# Patient Record
Sex: Male | Born: 1975 | Race: White | Hispanic: No | Marital: Single | Smoking: Former smoker
Health system: Southern US, Community
[De-identification: ages and names within clinical notes are randomized; demographics above are authoritative.]

## PROBLEM LIST (undated history)

## (undated) DIAGNOSIS — Q8500 Neurofibromatosis, unspecified: Secondary | ICD-10-CM

## (undated) DIAGNOSIS — F32A Depression, unspecified: Secondary | ICD-10-CM

## (undated) DIAGNOSIS — B019 Varicella without complication: Secondary | ICD-10-CM

## (undated) HISTORY — DX: Depression, unspecified: F32.A

## (undated) HISTORY — DX: Neurofibromatosis, unspecified: Q85.00

## (undated) HISTORY — PX: WISDOM TOOTH EXTRACTION: SHX21

## (undated) HISTORY — PX: OTHER SURGICAL HISTORY: SHX169

## (undated) HISTORY — DX: Varicella without complication: B01.9

---

## 2004-10-10 HISTORY — PX: OTHER SURGICAL HISTORY: SHX169

## 2005-01-27 ENCOUNTER — Ambulatory Visit: Payer: Self-pay | Admitting: Pulmonary Disease

## 2005-01-28 ENCOUNTER — Ambulatory Visit: Payer: Self-pay | Admitting: Cardiology

## 2005-01-31 ENCOUNTER — Ambulatory Visit (HOSPITAL_COMMUNITY): Admission: RE | Admit: 2005-01-31 | Discharge: 2005-01-31 | Payer: Self-pay | Admitting: Pulmonary Disease

## 2005-01-31 ENCOUNTER — Ambulatory Visit: Payer: Self-pay | Admitting: Cardiology

## 2005-02-04 ENCOUNTER — Ambulatory Visit (HOSPITAL_COMMUNITY): Admission: RE | Admit: 2005-02-04 | Discharge: 2005-02-04 | Payer: Self-pay | Admitting: Pulmonary Disease

## 2005-02-23 ENCOUNTER — Ambulatory Visit: Payer: Self-pay | Admitting: Pulmonary Disease

## 2005-03-18 ENCOUNTER — Inpatient Hospital Stay (HOSPITAL_COMMUNITY): Admission: RE | Admit: 2005-03-18 | Discharge: 2005-03-23 | Payer: Self-pay | Admitting: General Surgery

## 2005-03-18 ENCOUNTER — Encounter (INDEPENDENT_AMBULATORY_CARE_PROVIDER_SITE_OTHER): Payer: Self-pay | Admitting: Specialist

## 2005-05-27 ENCOUNTER — Encounter: Admission: RE | Admit: 2005-05-27 | Discharge: 2005-05-27 | Payer: Self-pay | Admitting: General Surgery

## 2007-10-11 HISTORY — PX: OTHER SURGICAL HISTORY: SHX169

## 2008-01-29 ENCOUNTER — Encounter (INDEPENDENT_AMBULATORY_CARE_PROVIDER_SITE_OTHER): Payer: Self-pay | Admitting: Neurosurgery

## 2008-01-29 ENCOUNTER — Inpatient Hospital Stay (HOSPITAL_COMMUNITY): Admission: RE | Admit: 2008-01-29 | Discharge: 2008-02-01 | Payer: Self-pay | Admitting: Neurosurgery

## 2009-08-19 ENCOUNTER — Encounter: Admission: RE | Admit: 2009-08-19 | Discharge: 2009-08-19 | Payer: Self-pay | Admitting: Neurosurgery

## 2010-10-31 ENCOUNTER — Encounter: Payer: Self-pay | Admitting: General Surgery

## 2011-02-22 NOTE — Op Note (Signed)
Eric Hansen, Eric Hansen                 ACCOUNT NO.:  [id number]   MEDICAL RECORD NO.:  [id number]          PATIENT TYPE:  INP   LOCATION:  3114                         FACILITY:  [facility]   PHYSICIAN:  Danae Orleans. Venetia Maxon, M.D.  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   PROCEDURE:  1. L1-L2 laminectomy.  2. Resection of intradural spinal cord tumor.  3. Microdissection.  4. EMG and SSEP monitoring.  5. Intraoperative ultrasound.   SURGEON:  Danae Orleans. Venetia Maxon, MD   ASSISTANT:  Cristi Loron, MD  Georgiann Cocker, RN   ANESTHESIA:  General endotracheal anesthesia.   ESTIMATED BLOOD LOSS:  250 mL.   PATHOLOGY:  Consistent with a schwannoma.   DISPOSITION:  Recovery.   COMPLICATIONS:  None.   INDICATIONS:  Eric Hansen is a 35 year old man with multiple  schwannomas, who has a large anterior intradural tumor consistent with  schwannoma at the level of L1-L2, which is causing him bowel and bladder  dysfunction and myelopathy.  It was elected to take him to surgery for  removal of this intradural tumor.  Because of the patient's young age as  well as the fact that he is having bowel and bladder control issues, we  elected to use SSEP and motor evoked monitoring including anal sphincter  monitoring during the surgery.   PROCEDURE:  Mr. Dollins was brought to the operating room.  Following  satisfactory and uncomplicated induction of general endotracheal  anesthesia and placing intravenous lines and Foley catheter, he was  placed in a prone position on the flat rolls.  Three spinal needles were  taped to his skin and an intraoperative x-ray of the middle spinal  needle was felt to be at the L1 level.   Subsequently, a incision was  planned from top of L1 to the bottom of L2.  The skin was then prepped  and draped in the usual sterile fashion.  Area of planned incision was  infiltrated local lidocaine.  Incision was made, carried through adipose  tissues of posterior thoracolumbar fascia which was then incised.  Bilateral subperiosteal dissection was performed exposing the L1 and L2  spinous processes.  After intraoperative confirmatory x-ray, these were  removed and subsequently a high-speed drill was used to complete the  laminectomy of L1-L2 and this was completed then with Kerrison rongeurs.  The spinal cord dura was then exposed.  Hemostasis was obtained.  The  microscope brought into the field.  Prior to bringing the microscope  into the field, intraoperative ultrasound was performed to confirm the  position of the intradural tumor.  A dural incision was made overlying  the superior and inferior aspects of the intraspinal tumor with 15 blade  and then using a Public house manager.  The dura was opened.  Dural tack up  stitches were placed  with 4-0 Nurolon stitches.  The arachnoid was then  opened.  This was all done under microscopic visualization.  The  arachnoid was opened and the tumor was identified.  Tumor was highly  friable and quite vascular.  Efforts were made to mobilize and caused it  to bleed.  Consequently, the tumor capsule was cauterized with bipolar  electrocautery and eventually after initial effort to remove it by  rolling it out of the spinal cord was not possible because it was  densely adherent.  It was elected to debulk the tumor and this was done  with bipolar electrocautery as well as CUSA.  The tumor was then removed  in its entirety.  There were small remaining fascicles which appeared to  be the origin of the tumor and these were cauterized with bipolar  electrocautery and removed with micro scissors.  The entire tumor  appeared to have been removed.   Hemostasis was assured with bipolar  cautery and Gelfoam.  The spinal cord appeared to be in good condition,  and the spinal canal was irrigated extensively in order to remove any  remaining blood.  The dura was then closed with 6-0 Prolene running  stitch and hemostasis was assured with Gelfoam along the edge of the  spinal canal.  With a Valsalva maneuver, there did not appear to be any  egress of CSF.  The self-retaining retractor was removed.  The posterior  spinal musculature was reapproximated with 0 Vicryl sutures.  This  fascia was reapproximated with 0 Vicryl sutures.  The subcutaneous  tissues were approximated with 2-0 Vicryl interrupted inverted sutures  and the skin edges were reapproximated with running 3-0 nylon lock  stitch.  The wound was dressed with bacitracin, Telfa, gauze, and tape.  The patient was x-rayed in the operating room and taken to recovery in  stable and satisfactory lesion.  Initial pathology was consistent with  schwannoma.  The patient had no EMG or SSEP perturbations throughout the  surgery.      Danae Orleans. Venetia Maxon, M.D.  Electronically Signed     JDS/MEDQ  D:  01/29/2008  T:  01/30/2008  Job:  [id number]

## 2011-02-25 NOTE — Op Note (Signed)
NAMEPEARLEY, BARANEK                 ACCOUNT NO.:  [id number]   MEDICAL RECORD NO.:  [id number]          PATIENT TYPE:  INP   LOCATION:  1613                         FACILITY:  [facility]   PHYSICIAN:  Adolph Pollack, M.D.DATE OF BIRTH:  07-20-1976   DATE OF PROCEDURE:  03/18/2005  DATE OF DISCHARGE:                                 OPERATIVE REPORT   PREOPERATIVE DIAGNOSIS:  Retroperitoneal lymphadenopathy.   POSTOPERATIVE DIAGNOSIS:  Retroperitoneal lymphadenopathy.   PROCEDURE:  Exploratory laparotomy with limited aortocaval lymph node  dissection (multiple lymph node biopsies).   SURGEON:  Adolph Pollack, M.D.   ANESTHESIA:  General.   INDICATIONS:  This is a 35 year old male who had some right lower quadrant  and groin pain.  No obvious hernia on exam.  He underwent a CT scan of the  abdomen and pelvis and was noted to have significant retroperitoneal  adenopathy in the aortocaval and retrocaval areas.  He has no testicular  masses on physical exam or on ultrasound.  A PET scan was done which showed  low hypermetabolic activity concerning for lymphoma.  He now presents for  biopsy.  The procedure and risks were discussed with him preoperatively.   TECHNIQUE:  He was seen in the holding area, then brought to the operating  room.  Placed supine on the operating room table.  General anesthetic was  administered.  The hair on the abdominal wall was clipped, and the area was  sterilely prepped and draped.  A midline incision was made through the skin  and subcutaneous tissue, fascia and peritoneum.  The abdomen was explored.  No liver or bowel abnormalities were noted.  No obvious palpable hernias in  the right inguinal area.  I went to the root of the mesentery, retracting  the omentum and transverse colon superiorly and could palpate the adenopathy  in the retroperitoneal area.  I identified the fourth portion of the  duodenum and retracted this superiorly.  I incised the  retroperitoneal  tissue on the fourth portion of the duodenum down towards the bifurcation of  the aorta.  Used careful dissection to identify the aortocaval adenopathy.  The aorta and vena cava were identified.  I carefully began dissecting along  the large aortocaval nodes, dissecting them free and placing clips on the  lymphatics.  The aorta was excised with a Bovie and sharply.  Multiple  specimens were sent.  There was some oozing.  I placed some Surgicel and a  dry laparotomy pad, and this resolved.   Following this, I made sure hemostasis was adequate.  Once it was, I  reapproximated the retroperitoneum with a running 0 Vicryl suture.  All  needle, sponge, and instrument counts were then reported to be correct.  Subsequently, reapproximated the fascia with a running #1 PDS suture.  The  subcutaneous tissue was then noted to be hemostatic, and the skin was then  closed with staples.  A sterile dressing was applied.   Patient tolerated the procedure well without any apparent complications and  was taken to the recovery room in satisfactory condition.  TJR/MEDQ  D:  03/18/2005  T:  03/18/2005  Job:  [id number]   cc:   Lonzo Cloud. Kriste Basque, M.D. [facility]

## 2011-02-25 NOTE — Discharge Summary (Signed)
NAMEREASE, WENCE                 ACCOUNT NO.:  [id number]   MEDICAL RECORD NO.:  [id number]          PATIENT TYPE:  INP   LOCATION:  1613                         FACILITY:  [facility]   PHYSICIAN:  Adolph Pollack, M.D.DATE OF BIRTH:  1976/04/16   DATE OF ADMISSION:  03/18/2005  DATE OF DISCHARGE:  03/23/2005                                 DISCHARGE SUMMARY   PRINCIPAL DIAGNOSIS:  Retroperitoneal schwannoma.   SECONDARY DIAGNOSES:  Postoperative ileus.   PROCEDURE:  Exploratory laparotomy and partial excision of retroperitoneal  schwannoma.   REASON FOR ADMISSION:  This is a 35 year old male having some right groin  pain.  No testicular masses were found on exam or ultrasound.  A CT scan  demonstrated significant retroperitoneal adenopathy in the aortocaval  region.  He had had some night sweats and concern was for lymphoma.  Needle  biopsy was unable to be obtained so open biopsy was indicated and he was  admitted for that.   HOSPITAL COURSE:  He tolerated the procedure fairly well.  He had some  nausea and some postoperative ileus.  Of note was that his pathology came  back consistent with a schwannoma.  Apparently there are several family  members who had schwannoma's as well.  He has started tolerating a clear  liquid diet and was able to have his diet advanced.  Bowels moved and by his  fifth postoperative day, he was able to be discharged.   DISCHARGE CONDITION:  Discharged to home on June 14/2006 in satisfactory  condition.   DISCHARGE MEDICATIONS:  Vicodin for pain.   ACTIVITY:  He was given activity restrictions.   DISCHARGE INSTRUCTIONS:  Wound care instructions.   FOLLOW-UP PLAN:  He will come back in for follow up, have staples removed  and call for any problems.       TJR/MEDQ  D:  04/11/2005  T:  04/11/2005  Job:  [id number]

## 2011-02-25 NOTE — Discharge Summary (Signed)
Eric Hansen, Eric Hansen                 ACCOUNT NO.:  [id number]   MEDICAL RECORD NO.:  [id number]          PATIENT TYPE:  INP   LOCATION:  3020                         FACILITY:  [facility]   PHYSICIAN:  Danae Orleans. Venetia Maxon, M.D.  DATE OF BIRTH:  December 07, 1975   DATE OF ADMISSION:  01/29/2008  DATE OF DISCHARGE:  02/01/2008                               DISCHARGE SUMMARY   REASON FOR ADMISSION:  Intraspinal intradural neoplasm with myelopathy.   FINAL DIAGNOSIS:  Intraspinal intradural neoplasm with myelopathy.   HISTORY OF ILLNESS AND HOSPITAL COURSE:  Eric Hansen is a 35 year old  man with intradural tumor at L1-2 with significant spinal myelopathy and  cord compression.  He underwent a same day's procedure admission, L1 and  L2 laminectomy with resection of intradural tumor with microdissection  using EMG and SSEP monitoring and intraoperative ultrasound.  Postoperatively, the patient did well, was kept on bedrest.  For the  first couple of days, he was observed in the ICU, was gradually  mobilized and doing well as of February 01, 2008.  He was discharged home  on Percocet and Robaxin with instructions to follow up in the office, 2  weeks postoperatively for staple removal.      Danae Orleans. Venetia Maxon, M.D.  Electronically Signed     JDS/MEDQ  D:  02/29/2008  T:  03/01/2008  Job:  [id number]

## 2011-07-05 LAB — CBC
MCHC: 35
MCV: 91.2
Platelets: 212
RDW: 13.6

## 2011-07-05 LAB — BASIC METABOLIC PANEL
BUN: 10
Chloride: 101
Creatinine, Ser: 0.88
Sodium: 136

## 2013-01-31 ENCOUNTER — Other Ambulatory Visit: Payer: Self-pay

## 2013-10-14 DIAGNOSIS — G8928 Other chronic postprocedural pain: Secondary | ICD-10-CM | POA: Insufficient documentation

## 2014-01-13 DIAGNOSIS — M5417 Radiculopathy, lumbosacral region: Secondary | ICD-10-CM | POA: Insufficient documentation

## 2014-04-09 DIAGNOSIS — M5416 Radiculopathy, lumbar region: Secondary | ICD-10-CM | POA: Insufficient documentation

## 2014-09-30 DIAGNOSIS — M545 Low back pain, unspecified: Secondary | ICD-10-CM | POA: Insufficient documentation

## 2014-11-19 ENCOUNTER — Encounter: Payer: Self-pay | Admitting: Physician Assistant

## 2014-11-19 ENCOUNTER — Ambulatory Visit (INDEPENDENT_AMBULATORY_CARE_PROVIDER_SITE_OTHER): Payer: BLUE CROSS/BLUE SHIELD | Admitting: Physician Assistant

## 2014-11-19 VITALS — BP 118/88 | HR 90 | Temp 97.9°F | Resp 16 | Ht 70.5 in | Wt 239.2 lb

## 2014-11-19 DIAGNOSIS — B9789 Other viral agents as the cause of diseases classified elsewhere: Principal | ICD-10-CM

## 2014-11-19 DIAGNOSIS — J069 Acute upper respiratory infection, unspecified: Secondary | ICD-10-CM

## 2014-11-19 LAB — POCT RAPID STREP A (OFFICE): RAPID STREP A SCREEN: NEGATIVE

## 2014-11-19 MED ORDER — BENZONATATE 100 MG PO CAPS: 100.0000 mg | ORAL_CAPSULE | Freq: Two times a day (BID) | ORAL | Status: DC | PRN

## 2014-11-19 NOTE — Progress Notes (Signed)
   Patient presents to clinic today c/o PND and scratchy throat since Sunday.  Denies fever, chills, aches.  Endorses some sinus pressure, but denies sinus pain, ear pain or tooth pain.  Endorses non-productive cough. Denies recent travel or sick contact. Has taken Nyquil and antihistamine without improvement in symptoms.  Past Medical History  Diagnosis Date  . Neurofibromatosis   . Chickenpox     No current outpatient prescriptions on file prior to visit.   No current facility-administered medications on file prior to visit.    Allergies  Allergen Reactions  . Morphine And Related Other (See Comments)    Family History  Problem Relation Age of Onset  . Diverticulitis Mother     Living  . Obesity Father     Living  . Other Father   . Neurofibromatosis Brother   . Neurofibromatosis Father   . Neurofibromatosis Paternal Uncle   . Neurofibromatosis Cousin     Paternal  . Alzheimer's disease Maternal Grandmother   . Thyroid cancer Mother     History   Social History  . Marital Status: Single    Spouse Name: N/A  . Number of Children: N/A  . 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 Smoking status: Former Smoker    Types: Cigarettes    Quit date: 09/30/2003  . Smokeless tobacco: Never Used  . Alcohol Use: No  . Drug Use: Yes     Comment: rare marijuana  . Sexual Activity:    Partners: Female   Other Topics Concern  . None   Social History Narrative   Review of Systems - See HPI.  All other ROS are negative.  BP 118/88 mmHg  Pulse 90  Temp(Src) 97.9 F (36.6 C) (Oral)  Resp 16  Ht 5' 10.5" (1.791 m)  Wt 239 lb 4 oz (108.523 kg)  BMI 33.83 kg/m2  SpO2 97%  Physical Exam  Constitutional: He is oriented to person, place, and time and well-developed, well-nourished, and in no distress.  HENT:  Head: Normocephalic and atraumatic.  Right Ear: Tympanic membrane, external ear and ear canal  normal.  Left Ear: Tympanic membrane, external ear and ear canal normal.  Nose: Nose normal.  Mouth/Throat: Uvula is midline and mucous membranes are normal. Posterior oropharyngeal erythema present. No oropharyngeal exudate, posterior oropharyngeal edema or tonsillar abscesses.  Eyes: Conjunctivae are normal.  Neck: Neck supple.  Cardiovascular: Normal rate, regular rhythm, normal heart sounds and intact distal pulses.   Pulmonary/Chest: Effort normal and breath sounds normal. No respiratory distress. He has no wheezes. He has no rales. He exhibits no tenderness.  Lymphadenopathy:    He has no cervical adenopathy.  Neurological: He is alert and oriented to person, place, and time.  Skin: Skin is warm and dry. No rash noted.  Psychiatric: Affect normal.  Vitals reviewed.  Assessment/Plan: Viral URI with cough POC strep negative. Suspect viral etiology.  Increase fluids.  Rest. Salt water gargles and OTC medication for throat pain. Mucinex for congestion.  Tessalon per orders for cough. Return precautions discussed with patient.

## 2014-11-19 NOTE — Patient Instructions (Signed)
Your symptoms seem consistent with a viral upper respiratory infection.  Your exam looks good overall and your strep test was negative.  I recommend you increase your fluids and get plenty of rest. Salt-water gargles and your pain medication will help with throat. A humidifier in the bedroom will also be beneficial. Please use the Tessalon Perles as directed for cough.  Call or return to clinic if symptoms worsen or are not continuing to resolve.  Viral Infections A virus is a type of germ. Viruses can cause:  Minor sore throats.  Aches and pains.  Headaches.  Runny nose.  Rashes.  Watery eyes.  Tiredness.  Coughs.  Loss of appetite.  Feeling sick to your stomach (nausea).  Throwing up (vomiting).  Watery poop (diarrhea). HOME CARE   Only take medicines as told by your doctor.  Drink enough water and fluids to keep your pee (urine) clear or pale yellow. Sports drinks are a good choice.  Get plenty of rest and eat healthy. Soups and broths with crackers or rice are fine. GET HELP RIGHT AWAY IF:   You have a very bad headache.  You have shortness of breath.  You have chest pain or neck pain.  You have an unusual rash.  You cannot stop throwing up.  You have watery poop that does not stop.  You cannot keep fluids down.  You or your child has a temperature by mouth above 102 F (38.9 C), not controlled by medicine.  Your baby is older than 3 months with a rectal temperature of 102 F (38.9 C) or higher.  Your baby is 54 months old or younger with a rectal temperature of 100.4 F (38 C) or higher. MAKE SURE YOU:   Understand these instructions.  Will watch this condition.  Will get help right away if you are not doing well or get worse. Document Released: 09/08/2008 Document Revised: 12/19/2011 Document Reviewed: 02/01/2011 [facility] Patient Information 2015 [place], [place]. This information is not intended to replace advice given to you by your health  care provider. Make sure you discuss any questions you have with your health care provider.

## 2014-11-19 NOTE — Progress Notes (Signed)
Pre visit review using our clinic review tool, if applicable. No additional management support is needed unless otherwise documented below in the visit note/SLS  

## 2014-11-19 NOTE — Assessment & Plan Note (Signed)
POC strep negative. Suspect viral etiology.  Increase fluids.  Rest. Salt water gargles and OTC medication for throat pain. Mucinex for congestion.  Tessalon per orders for cough. Return precautions discussed with patient.

## 2014-12-08 ENCOUNTER — Ambulatory Visit: Payer: Self-pay | Admitting: Physician Assistant

## 2014-12-11 ENCOUNTER — Telehealth: Payer: Self-pay

## 2014-12-11 NOTE — Telephone Encounter (Signed)
Medication: Review, verify sig & reconcile(including outside meds): yes Duplicates discarded: n/a  DM supply source:  n/a  Local pharmacy: [id number] - [place], [place] - [address] AT [place]  Allergies verified: yes  Immunization Status: Prompted for insurance verification: yes Flu vaccine--DUE Tdap--DUE   A/P:   Changes to [place], PSH or Personal Hx: Reviewed. No changes.  Care Teams Updated: ED/Hospital/Urgent Care Visits:  No Prompted for: Updated insurance, contact information, forms: yes Remind to bring: DPR information, advance directives: yes  To Discuss with Provider:  Nothing at this time.

## 2014-12-15 ENCOUNTER — Ambulatory Visit (INDEPENDENT_AMBULATORY_CARE_PROVIDER_SITE_OTHER): Payer: BLUE CROSS/BLUE SHIELD | Admitting: Physician Assistant

## 2014-12-15 ENCOUNTER — Encounter: Payer: Self-pay | Admitting: Physician Assistant

## 2014-12-15 VITALS — BP 105/61 | HR 81 | Temp 97.6°F | Resp 16 | Ht 70.5 in | Wt 246.0 lb

## 2014-12-15 DIAGNOSIS — Z23 Encounter for immunization: Secondary | ICD-10-CM | POA: Insufficient documentation

## 2014-12-15 DIAGNOSIS — Q8500 Neurofibromatosis, unspecified: Secondary | ICD-10-CM

## 2014-12-15 DIAGNOSIS — Z0000 Encounter for general adult medical examination without abnormal findings: Secondary | ICD-10-CM | POA: Diagnosis not present

## 2014-12-15 LAB — HEPATIC FUNCTION PANEL
ALT: 32 U/L (ref 0–53)
AST: 17 U/L (ref 0–37)
Albumin: 4.5 g/dL (ref 3.5–5.2)
Alkaline Phosphatase: 96 U/L (ref 39–117)
BILIRUBIN DIRECT: 0.1 mg/dL (ref 0.0–0.3)
BILIRUBIN TOTAL: 0.3 mg/dL (ref 0.2–1.2)
Total Protein: 7.4 g/dL (ref 6.0–8.3)

## 2014-12-15 LAB — BASIC METABOLIC PANEL
BUN: 20 mg/dL (ref 6–23)
CALCIUM: 9.5 mg/dL (ref 8.4–10.5)
CHLORIDE: 108 meq/L (ref 96–112)
CO2: 25 meq/L (ref 19–32)
Creatinine, Ser: 0.82 mg/dL (ref 0.40–1.50)
GFR: 111.43 mL/min (ref >60.00)
Glucose, Bld: 94 mg/dL (ref 70–99)
Potassium: 4 mEq/L (ref 3.5–5.1)
SODIUM: 139 meq/L (ref 135–145)

## 2014-12-15 LAB — LIPID PANEL
CHOL/HDL RATIO: 5
Cholesterol: 210 mg/dL — ABNORMAL HIGH (ref 0–200)
HDL: 44 mg/dL (ref >39.00)
LDL Cholesterol: 135 mg/dL — ABNORMAL HIGH (ref 0–99)
NonHDL: 166
TRIGLYCERIDES: 156 mg/dL — AB (ref 0.0–149.0)
VLDL: 31.2 mg/dL (ref 0.0–40.0)

## 2014-12-15 LAB — URINALYSIS, ROUTINE W REFLEX MICROSCOPIC
BILIRUBIN URINE: NEGATIVE
HGB URINE DIPSTICK: NEGATIVE
Ketones, ur: NEGATIVE
LEUKOCYTES UA: NEGATIVE
NITRITE: NEGATIVE
Specific Gravity, Urine: 1.025 (ref 1.000–1.030)
Total Protein, Urine: NEGATIVE
Urine Glucose: NEGATIVE
Urobilinogen, UA: 0.2 (ref 0.0–1.0)
WBC, UA: NONE SEEN — AB (ref 0–?)
pH: 5.5 (ref 5.0–8.0)

## 2014-12-15 LAB — CBC
HEMATOCRIT: 43.3 % (ref 39.0–52.0)
Hemoglobin: 14.7 g/dL (ref 13.0–17.0)
MCHC: 33.9 g/dL (ref 30.0–36.0)
MCV: 87.8 fl (ref 78.0–100.0)
Platelets: 228 10*3/uL (ref 150.0–400.0)
RBC: 4.93 Mil/uL (ref 4.22–5.81)
RDW: 14.4 % (ref 11.5–15.5)
WBC: 8.4 10*3/uL (ref 4.0–10.5)

## 2014-12-15 LAB — HIV ANTIBODY (ROUTINE TESTING W REFLEX): HIV 1&2 Ab, 4th Generation: NONREACTIVE

## 2014-12-15 LAB — HEMOGLOBIN A1C: Hgb A1c MFr Bld: 5.7 % (ref 4.6–6.5)

## 2014-12-15 LAB — TSH: TSH: 1.48 u[IU]/mL (ref 0.35–4.50)

## 2014-12-15 NOTE — Assessment & Plan Note (Signed)
I have reviewed the patient's medical history in detail and updated the computerized patient record.  Health Maintenance up-to-date now that flu and Tetanus have been given.  Discussed HIV screening per USPSTF guidelines.  Patient agreeable.  Will obtain fasting labs today to include HIV screening.  PHQ-2 Screen performed with score of 0.  Preventive care discussed.  Handout given.

## 2014-12-15 NOTE — Assessment & Plan Note (Signed)
Followed by Neurology and Pain Management.  Pain well controlled presently. Continue current regimen.  Follow-up with specialists as scheduled.

## 2014-12-15 NOTE — Progress Notes (Signed)
Patient presents to clinic today to formally establish care. Was seen previously for an acute concern only.  Patient is requesting CPE today.  Is fasting for blood work.  Chronic Issues: Neurofibromatosis -- Followed by Neurology (Dr. Vertell Limber -- [facility]) Also followed by Pain Management (Dr. Shearon Stalls -- [facility]).  Currently on Lyrica and Hydrocodone with good relief.  Also on Pamelor prn for sleep.  Health Maintenance: Dental -- up-to-date Vision -- up-to-date Immunizations -- Flu and Tetanus are due.  Will be given today.  Past Medical History  Diagnosis Date  . Neurofibromatosis   . Chickenpox     Past Surgical History  Procedure Laterality Date  . Biopsy abdomen  2006  . Spinal tumor  2009    Benign  . Wisdom tooth extraction      Current Outpatient Prescriptions on File Prior to Visit  Medication Sig Dispense Refill  . HYDROcodone-acetaminophen (NORCO) 7.5-325 MG per tablet Take 1 tablet by mouth every 6 (six) hours as needed for moderate pain.    . pregabalin (LYRICA) 150 MG capsule Take 150 mg by mouth 3 (three) times daily.     No current facility-administered medications on file prior to visit.    Allergies  Allergen Reactions  . Morphine And Related Other (See Comments)    Family History  Problem Relation Age of Onset  . Diverticulitis Mother     Living  . Obesity Father     Living  . Other Father   . Neurofibromatosis Brother   . Neurofibromatosis Father   . Neurofibromatosis Paternal Uncle   . Neurofibromatosis Cousin     Paternal  . Alzheimer's disease Maternal Grandmother   . Thyroid cancer Mother     History   Social History  . Marital Status: Single    Spouse Name: N/A  . Number of Children: N/A  . 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 Smoking status: Former Smoker    Types: Cigarettes    Quit date: 09/30/2003  . Smokeless tobacco: Never Used  .  Alcohol Use: No  . Drug Use: Yes     Comment: rare marijuana  . Sexual Activity:    Partners: Female   Other Topics Concern  . Not on file   Social History Narrative   Review of Systems  Constitutional: Negative for fever and weight loss.  HENT: Negative for ear discharge, ear pain, hearing loss and tinnitus.   Eyes: Negative for blurred vision, double vision, photophobia and pain.  Respiratory: Negative for cough and shortness of breath.   Cardiovascular: Negative for chest pain and palpitations.  Gastrointestinal: Negative for heartburn, nausea, vomiting, abdominal pain, diarrhea, constipation, blood in stool and melena.  Genitourinary: Negative for dysuria, urgency, frequency, hematuria and flank pain.  Musculoskeletal: Negative for falls.  Neurological: Negative for dizziness, loss of consciousness and headaches.  Endo/Heme/Allergies: Negative for environmental allergies.  Psychiatric/Behavioral: Negative for depression, suicidal ideas, hallucinations and substance abuse. The patient is not nervous/anxious and does not have insomnia.     BP 105/61 mmHg  Pulse 81  Temp(Src) 97.6 F (36.4 C) (Oral)  Resp 16  Ht 5' 10.5" (1.791 m)  Wt 246 lb (111.585 kg)  BMI 34.79 kg/m2  SpO2 98%  Physical Exam  Constitutional: He is oriented to person, place, and time and well-developed, well-nourished, and in no distress.  HENT:  Head: Normocephalic and atraumatic.  Right Ear: External  ear normal.  Left Ear: External ear normal.  Nose: Nose normal.  Mouth/Throat: Oropharynx is clear and moist. No oropharyngeal exudate.  Eyes: Conjunctivae and EOM are normal. Pupils are equal, round, and reactive to light.  Neck: Neck supple. No thyromegaly present.  Cardiovascular: Normal rate, regular rhythm, normal heart sounds and intact distal pulses.   Pulmonary/Chest: Effort normal and breath sounds normal. No respiratory distress. He has no wheezes. He has no rales. He exhibits no tenderness.    Abdominal: Soft. Bowel sounds are normal. He exhibits no distension and no mass. There is no tenderness. There is no rebound and no guarding.  Genitourinary: Testes/scrotum normal and penis normal. No discharge found.  Lymphadenopathy:    He has no cervical adenopathy.  Neurological: He is alert and oriented to person, place, and time.  Skin: Skin is warm and dry. No rash noted.  Psychiatric: Affect normal.  Vitals reviewed.   Recent Results (from the past 2160 hour(s))  POCT rapid strep A     Status: None   Collection Time: 11/19/14  2:39 PM  Result Value Ref Range   Rapid Strep A Screen Negative Negative    Assessment/Plan: Encounter for immunization Flu shot given by nursing staff.   Need for diphtheria-tetanus-pertussis (Tdap) vaccine, adult/adolescent TDaP given by nursing staff.    Neurofibromatosis Followed by Neurology and Pain Management.  Pain well controlled presently. Continue current regimen.  Follow-up with specialists as scheduled.   Visit for preventive health examination I have reviewed the patient's medical history in detail and updated the computerized patient record.  Health Maintenance up-to-date now that flu and Tetanus have been given.  Discussed HIV screening per USPSTF guidelines.  Patient agreeable.  Will obtain fasting labs today to include HIV screening.  PHQ-2 Screen performed with score of 0.  Preventive care discussed.  Handout given.

## 2014-12-15 NOTE — Assessment & Plan Note (Signed)
TDaP given by nursing staff. 

## 2014-12-15 NOTE — Assessment & Plan Note (Signed)
Flu shot given by nursing staff. 

## 2014-12-15 NOTE — Patient Instructions (Signed)
Please continue medications as directed. Follow-up with your specialists as scheduled.  Stop by the lab for blood work. I will call you with your results. Follow-up with me will be based on your lab results.   Preventive Care for Adults A healthy lifestyle and preventive care can promote health and wellness. Preventive health guidelines for men include the following key practices:  A routine yearly physical is a good way to check with your health care provider about your health and preventative screening. It is a chance to share any concerns and updates on your health and to receive a thorough exam.  Visit your dentist for a routine exam and preventative care every 6 months. Brush your teeth twice a day and floss once a day. Good oral hygiene prevents tooth decay and gum disease.  The frequency of eye exams is based on your age, health, family medical history, use of contact lenses, and other factors. Follow your health care provider's recommendations for frequency of eye exams.  Eat a healthy diet. Foods such as vegetables, fruits, whole grains, low-fat dairy products, and lean protein foods contain the nutrients you need without too many calories. Decrease your intake of foods high in solid fats, added sugars, and salt. Eat the right amount of calories for you.Get information about a proper diet from your health care provider, if necessary.  Regular physical exercise is one of the most important things you can do for your health. Most adults should get at least 150 minutes of moderate-intensity exercise (any activity that increases your heart rate and causes you to sweat) each week. In addition, most adults need muscle-strengthening exercises on 2 or more days a week.  Maintain a healthy weight. The body mass index (BMI) is a screening tool to identify possible weight problems. It provides an estimate of body fat based on height and weight. Your health care provider can find your BMI and can  help you achieve or maintain a healthy weight.For adults 20 years and older:  A BMI below 18.5 is considered underweight.  A BMI of 18.5 to 24.9 is normal.  A BMI of 25 to 29.9 is considered overweight.  A BMI of 30 and above is considered obese.  Maintain normal blood lipids and cholesterol levels by exercising and minimizing your intake of saturated fat. Eat a balanced diet with plenty of fruit and vegetables. Blood tests for lipids and cholesterol should begin at age 76 and be repeated every 5 years. If your lipid or cholesterol levels are high, you are over 50, or you are at high risk for heart disease, you may need your cholesterol levels checked more frequently.Ongoing high lipid and cholesterol levels should be treated with medicines if diet and exercise are not working.  If you smoke, find out from your health care provider how to quit. If you do not use tobacco, do not start.  Lung cancer screening is recommended for adults aged 84-80 years who are at high risk for developing lung cancer because of a history of smoking. A yearly low-dose CT scan of the lungs is recommended for people who have at least a 30-pack-year history of smoking and are a current smoker or have quit within the past 15 years. A pack year of smoking is smoking an average of 1 pack of cigarettes a day for 1 year (for example: 1 pack a day for 30 years or 2 packs a day for 15 years). Yearly screening should continue until the smoker has stopped smoking  for at least 15 years. Yearly screening should be stopped for people who develop a health problem that would prevent them from having lung cancer treatment.  If you choose to drink alcohol, do not have more than 2 drinks per day. One drink is considered to be 12 ounces (355 mL) of beer, 5 ounces (148 mL) of wine, or 1.5 ounces (44 mL) of liquor.  Avoid use of street drugs. Do not share needles with anyone. Ask for help if you need support or instructions about stopping  the use of drugs.  High blood pressure causes heart disease and increases the risk of stroke. Your blood pressure should be checked at least every 1-2 years. Ongoing high blood pressure should be treated with medicines, if weight loss and exercise are not effective.  If you are 39-75 years old, ask your health care provider if you should take aspirin to prevent heart disease.  Diabetes screening involves taking a blood sample to check your fasting blood sugar level. This should be done once every 3 years, after age 79, if you are within normal weight and without risk factors for diabetes. Testing should be considered at a younger age or be carried out more frequently if you are overweight and have at least 1 risk factor for diabetes.  Colorectal cancer can be detected and often prevented. Most routine colorectal cancer screening begins at the age of 25 and continues through age 76. However, your health care provider may recommend screening at an earlier age if you have risk factors for colon cancer. On a yearly basis, your health care provider may provide home test kits to check for hidden blood in the stool. Use of a small camera at the end of a tube to directly examine the colon (sigmoidoscopy or colonoscopy) can detect the earliest forms of colorectal cancer. Talk to your health care provider about this at age 80, when routine screening begins. Direct exam of the colon should be repeated every 5-10 years through age 81, unless early forms of precancerous polyps or small growths are found.  People who are at an increased risk for hepatitis B should be screened for this virus. You are considered at high risk for hepatitis B if:  You were born in a country where hepatitis B occurs often. Talk with your health care provider about which countries are considered high risk.  Your parents were born in a high-risk country and you have not received a shot to protect against hepatitis B (hepatitis B  vaccine).  You have HIV or AIDS.  You use needles to inject street drugs.  You live with, or have sex with, someone who has hepatitis B.  You are a man who has sex with other men (MSM).  You get hemodialysis treatment.  You take certain medicines for conditions such as cancer, organ transplantation, and autoimmune conditions.  Hepatitis C blood testing is recommended for all people born from [birth year] through 1965 and any individual with known risks for hepatitis C.  Practice safe sex. Use condoms and avoid high-risk sexual practices to reduce the spread of sexually transmitted infections (STIs). STIs include gonorrhea, chlamydia, syphilis, trichomonas, herpes, HPV, and human immunodeficiency virus (HIV). Herpes, HIV, and HPV are viral illnesses that have no cure. They can result in disability, cancer, and death.  If you are at risk of being infected with HIV, it is recommended that you take a prescription medicine daily to prevent HIV infection. This is called preexposure prophylaxis (PrEP). You are  considered at risk if:  You are a man who has sex with other men (MSM) and have other risk factors.  You are a heterosexual man, are sexually active, and are at increased risk for HIV infection.  You take drugs by injection.  You are sexually active with a partner who has HIV.  Talk with your health care provider about whether you are at high risk of being infected with HIV. If you choose to begin PrEP, you should first be tested for HIV. You should then be tested every 3 months for as long as you are taking PrEP.  A one-time screening for abdominal aortic aneurysm (AAA) and surgical repair of large AAAs by ultrasound are recommended for men ages 69 to 24 years who are current or former smokers.  Healthy men should no longer receive prostate-specific antigen (PSA) blood tests as part of routine cancer screening. Talk with your health care provider about prostate cancer screening.  Testicular  cancer screening is not recommended for adult males who have no symptoms. Screening includes self-exam, a health care provider exam, and other screening tests. Consult with your health care provider about any symptoms you have or any concerns you have about testicular cancer.  Use sunscreen. Apply sunscreen liberally and repeatedly throughout the day. You should seek shade when your shadow is shorter than you. Protect yourself by wearing long sleeves, pants, a wide-brimmed hat, and sunglasses year round, whenever you are outdoors.  Once a month, do a whole-body skin exam, using a mirror to look at the skin on your back. Tell your health care provider about new moles, moles that have irregular borders, moles that are larger than a pencil eraser, or moles that have changed in shape or color.  Stay current with required vaccines (immunizations).  Influenza vaccine. All adults should be immunized every year.  Tetanus, diphtheria, and acellular pertussis (Td, Tdap) vaccine. An adult who has not previously received Tdap or who does not know his vaccine status should receive 1 dose of Tdap. This initial dose should be followed by tetanus and diphtheria toxoids (Td) booster doses every 10 years. Adults with an unknown or incomplete history of completing a 3-dose immunization series with Td-containing vaccines should begin or complete a primary immunization series including a Tdap dose. Adults should receive a Td booster every 10 years.  Varicella vaccine. An adult without evidence of immunity to varicella should receive 2 doses or a second dose if he has previously received 1 dose.  Human papillomavirus (HPV) vaccine. Males aged 57-21 years who have not received the vaccine previously should receive the 3-dose series. Males aged 22-26 years may be immunized. Immunization is recommended through the age of 12 years for any male who has sex with males and did not get any or all doses earlier. Immunization is  recommended for any person with an immunocompromised condition through the age of 54 years if he did not get any or all doses earlier. During the 3-dose series, the second dose should be obtained 4-8 weeks after the first dose. The third dose should be obtained 24 weeks after the first dose and 16 weeks after the second dose.  Zoster vaccine. One dose is recommended for adults aged 55 years or older unless certain conditions are present.  Measles, mumps, and rubella (MMR) vaccine. Adults born before [birth year] generally are considered immune to measles and mumps. Adults born in [birth year] or later should have 1 or more doses of MMR vaccine unless there is a  contraindication to the vaccine or there is laboratory evidence of immunity to each of the three diseases. A routine second dose of MMR vaccine should be obtained at least 28 days after the first dose for students attending postsecondary schools, health care workers, or international travelers. People who received inactivated measles vaccine or an unknown type of measles vaccine during 1963-1967 should receive 2 doses of MMR vaccine. People who received inactivated mumps vaccine or an unknown type of mumps vaccine before 1979 and are at high risk for mumps infection should consider immunization with 2 doses of MMR vaccine. Unvaccinated health care workers born before [birth year] who lack laboratory evidence of measles, mumps, or rubella immunity or laboratory confirmation of disease should consider measles and mumps immunization with 2 doses of MMR vaccine or rubella immunization with 1 dose of MMR vaccine.  Pneumococcal 13-valent conjugate (PCV13) vaccine. When indicated, a person who is uncertain of his immunization history and has no record of immunization should receive the PCV13 vaccine. An adult aged 75 years or older who has certain medical conditions and has not been previously immunized should receive 1 dose of PCV13 vaccine. This PCV13 should be followed with a dose  of pneumococcal polysaccharide (PPSV23) vaccine. The PPSV23 vaccine dose should be obtained at least 8 weeks after the dose of PCV13 vaccine. An adult aged 63 years or older who has certain medical conditions and previously received 1 or more doses of PPSV23 vaccine should receive 1 dose of PCV13. The PCV13 vaccine dose should be obtained 1 or more years after the last PPSV23 vaccine dose.  Pneumococcal polysaccharide (PPSV23) vaccine. When PCV13 is also indicated, PCV13 should be obtained first. All adults aged 51 years and older should be immunized. An adult younger than age 2 years who has certain medical conditions should be immunized. Any person who resides in a nursing home or long-term care facility should be immunized. An adult smoker should be immunized. People with an immunocompromised condition and certain other conditions should receive both PCV13 and PPSV23 vaccines. People with human immunodeficiency virus (HIV) infection should be immunized as soon as possible after diagnosis. Immunization during chemotherapy or radiation therapy should be avoided. Routine use of PPSV23 vaccine is not recommended for American Indians, [place] Natives, or people younger than 65 years unless there are medical conditions that require PPSV23 vaccine. When indicated, people who have unknown immunization and have no record of immunization should receive PPSV23 vaccine. One-time revaccination 5 years after the first dose of PPSV23 is recommended for people aged 19-64 years who have chronic kidney failure, nephrotic syndrome, asplenia, or immunocompromised conditions. People who received 1-2 doses of PPSV23 before age 40 years should receive another dose of PPSV23 vaccine at age 39 years or later if at least 5 years have passed since the previous dose. Doses of PPSV23 are not needed for people immunized with PPSV23 at or after age 40 years.  Meningococcal vaccine. Adults with asplenia or persistent complement component  deficiencies should receive 2 doses of quadrivalent meningococcal conjugate (MenACWY-D) vaccine. The doses should be obtained at least 2 months apart. Microbiologists working with certain meningococcal bacteria, [employer] recruits, people at risk during an outbreak, and people who travel to or live in countries with a high rate of meningitis should be immunized. A first-year college student up through age 34 years who is living in a residence hall should receive a dose if he did not receive a dose on or after his 16th birthday. Adults who have certain  high-risk conditions should receive one or more doses of vaccine.  Hepatitis A vaccine. Adults who wish to be protected from this disease, have certain high-risk conditions, work with hepatitis A-infected animals, work in hepatitis A research labs, or travel to or work in countries with a high rate of hepatitis A should be immunized. Adults who were previously unvaccinated and who anticipate close contact with an international adoptee during the first 60 days after arrival in the [place] from a country with a high rate of hepatitis A should be immunized.  Hepatitis B vaccine. Adults should be immunized if they wish to be protected from this disease, have certain high-risk conditions, may be exposed to blood or other infectious body fluids, are household contacts or sex partners of hepatitis B positive people, are clients or workers in certain care facilities, or travel to or work in countries with a high rate of hepatitis B.  Haemophilus influenzae type b (Hib) vaccine. A previously unvaccinated person with asplenia or sickle cell disease or having a scheduled splenectomy should receive 1 dose of Hib vaccine. Regardless of previous immunization, a recipient of a hematopoietic stem cell transplant should receive a 3-dose series 6-12 months after his successful transplant. Hib vaccine is not recommended for adults with HIV infection. Preventive Service /  Frequency Ages 28 to 18  Blood pressure check.** / Every 1 to 2 years.  Lipid and cholesterol check.** / Every 5 years beginning at age 72.  Hepatitis C blood test.** / For any individual with known risks for hepatitis C.  Skin self-exam. / Monthly.  Influenza vaccine. / Every year.  Tetanus, diphtheria, and acellular pertussis (Tdap, Td) vaccine.** / Consult your health care provider. 1 dose of Td every 10 years.  Varicella vaccine.** / Consult your health care provider.  HPV vaccine. / 3 doses over 6 months, if 30 or younger.  Measles, mumps, rubella (MMR) vaccine.** / You need at least 1 dose of MMR if you were born in 1957 or later. You may also need a second dose.  Pneumococcal 13-valent conjugate (PCV13) vaccine.** / Consult your health care provider.  Pneumococcal polysaccharide (PPSV23) vaccine.** / 1 to 2 doses if you smoke cigarettes or if you have certain conditions.  Meningococcal vaccine.** / 1 dose if you are age 52 to 73 years and a Market researcher living in a residence hall, or have one of several medical conditions. You may also need additional booster doses.  Hepatitis A vaccine.** / Consult your health care provider.  Hepatitis B vaccine.** / Consult your health care provider.  Haemophilus influenzae type b (Hib) vaccine.** / Consult your health care provider. Ages 53 to 20  Blood pressure check.** / Every 1 to 2 years.  Lipid and cholesterol check.** / Every 5 years beginning at age 26.  Lung cancer screening. / Every year if you are aged 48-80 years and have a 30-pack-year history of smoking and currently smoke or have quit within the past 15 years. Yearly screening is stopped once you have quit smoking for at least 15 years or develop a health problem that would prevent you from having lung cancer treatment.  Fecal occult blood test (FOBT) of stool. / Every year beginning at age 49 and continuing until age 80. You may not have to do this test  if you get a colonoscopy every 10 years.  Flexible sigmoidoscopy** or colonoscopy.** / Every 5 years for a flexible sigmoidoscopy or every 10 years for a colonoscopy beginning at age  11 and continuing until age 44.  Hepatitis C blood test.** / For all people born from [birth year] through 1965 and any individual with known risks for hepatitis C.  Skin self-exam. / Monthly.  Influenza vaccine. / Every year.  Tetanus, diphtheria, and acellular pertussis (Tdap/Td) vaccine.** / Consult your health care provider. 1 dose of Td every 10 years.  Varicella vaccine.** / Consult your health care provider.  Zoster vaccine.** / 1 dose for adults aged 79 years or older.  Measles, mumps, rubella (MMR) vaccine.** / You need at least 1 dose of MMR if you were born in 1957 or later. You may also need a second dose.  Pneumococcal 13-valent conjugate (PCV13) vaccine.** / Consult your health care provider.  Pneumococcal polysaccharide (PPSV23) vaccine.** / 1 to 2 doses if you smoke cigarettes or if you have certain conditions.  Meningococcal vaccine.** / Consult your health care provider.  Hepatitis A vaccine.** / Consult your health care provider.  Hepatitis B vaccine.** / Consult your health care provider.  Haemophilus influenzae type b (Hib) vaccine.** / Consult your health care provider. Ages 48 and over  Blood pressure check.** / Every 1 to 2 years.  Lipid and cholesterol check.**/ Every 5 years beginning at age 66.  Lung cancer screening. / Every year if you are aged 90-80 years and have a 30-pack-year history of smoking and currently smoke or have quit within the past 15 years. Yearly screening is stopped once you have quit smoking for at least 15 years or develop a health problem that would prevent you from having lung cancer treatment.  Fecal occult blood test (FOBT) of stool. / Every year beginning at age 46 and continuing until age 46. You may not have to do this test if you get a colonoscopy  every 10 years.  Flexible sigmoidoscopy** or colonoscopy.** / Every 5 years for a flexible sigmoidoscopy or every 10 years for a colonoscopy beginning at age 48 and continuing until age 1.  Hepatitis C blood test.** / For all people born from [birth year] through 1965 and any individual with known risks for hepatitis C.  Abdominal aortic aneurysm (AAA) screening.** / A one-time screening for ages 32 to 56 years who are current or former smokers.  Skin self-exam. / Monthly.  Influenza vaccine. / Every year.  Tetanus, diphtheria, and acellular pertussis (Tdap/Td) vaccine.** / 1 dose of Td every 10 years.  Varicella vaccine.** / Consult your health care provider.  Zoster vaccine.** / 1 dose for adults aged 5 years or older.  Pneumococcal 13-valent conjugate (PCV13) vaccine.** / Consult your health care provider.  Pneumococcal polysaccharide (PPSV23) vaccine.** / 1 dose for all adults aged 25 years and older.  Meningococcal vaccine.** / Consult your health care provider.  Hepatitis A vaccine.** / Consult your health care provider.  Hepatitis B vaccine.** / Consult your health care provider.  Haemophilus influenzae type b (Hib) vaccine.** / Consult your health care provider. **Family history and personal history of risk and conditions may change your health care provider's recommendations. Document Released: 11/22/2001 Document Revised: 10/01/2013 Document Reviewed: 02/21/2011 [facility] Patient Information 2015 [place], [place]. This information is not intended to replace advice given to you by your health care provider. Make sure you discuss any questions you have with your health care provider.

## 2014-12-15 NOTE — Progress Notes (Signed)
Pre visit review using our clinic review tool, if applicable. No additional management support is needed unless otherwise documented below in the visit note. 

## 2015-03-17 DIAGNOSIS — D3610 Benign neoplasm of peripheral nerves and autonomic nervous system, unspecified: Secondary | ICD-10-CM | POA: Insufficient documentation

## 2015-03-17 DIAGNOSIS — M48061 Spinal stenosis, lumbar region without neurogenic claudication: Secondary | ICD-10-CM | POA: Insufficient documentation

## 2015-05-25 ENCOUNTER — Encounter: Payer: Self-pay | Admitting: Radiation Oncology

## 2015-05-25 ENCOUNTER — Other Ambulatory Visit: Payer: BLUE CROSS/BLUE SHIELD

## 2015-05-25 ENCOUNTER — Ambulatory Visit (HOSPITAL_BASED_OUTPATIENT_CLINIC_OR_DEPARTMENT_OTHER): Payer: BLUE CROSS/BLUE SHIELD | Admitting: Genetic Counselor

## 2015-05-25 ENCOUNTER — Ambulatory Visit
Admission: RE | Admit: 2015-05-25 | Discharge: 2015-05-25 | Disposition: A | Discharge status: Home / Self Care | Payer: BLUE CROSS/BLUE SHIELD | Source: Ambulatory Visit | Attending: Radiation Oncology | Admitting: Radiation Oncology

## 2015-05-25 ENCOUNTER — Encounter: Payer: Self-pay | Admitting: Genetic Counselor

## 2015-05-25 VITALS — BP 116/76 | HR 94 | Resp 16 | Ht 72.0 in | Wt 245.6 lb

## 2015-05-25 DIAGNOSIS — Z87891 Personal history of nicotine dependence: Secondary | ICD-10-CM | POA: Insufficient documentation

## 2015-05-25 DIAGNOSIS — D492 Neoplasm of unspecified behavior of bone, soft tissue, and skin: Secondary | ICD-10-CM | POA: Diagnosis not present

## 2015-05-25 DIAGNOSIS — D3610 Benign neoplasm of peripheral nerves and autonomic nervous system, unspecified: Secondary | ICD-10-CM | POA: Insufficient documentation

## 2015-05-25 DIAGNOSIS — Z315 Encounter for genetic counseling: Secondary | ICD-10-CM | POA: Diagnosis not present

## 2015-05-25 DIAGNOSIS — D3615 Benign neoplasm of peripheral nerves and autonomic nervous system of abdomen: Secondary | ICD-10-CM | POA: Diagnosis not present

## 2015-05-25 DIAGNOSIS — Q8500 Neurofibromatosis, unspecified: Secondary | ICD-10-CM

## 2015-05-25 DIAGNOSIS — C479 Malignant neoplasm of peripheral nerves and autonomic nervous system, unspecified: Secondary | ICD-10-CM | POA: Insufficient documentation

## 2015-05-25 NOTE — Progress Notes (Addendum)
REFERRING PROVIDER: Brunetta Jeans, PA-C [place], [place] [address]  PRIMARY PROVIDER:  Leeanne Rio, PA-C  PRIMARY REASON FOR VISIT:  1. Schwannoma   2. Neurofibromatosis      HISTORY OF PRESENT ILLNESS:   Mr. Eric Hansen, a 39 y.o. male, was seen for a [place] cancer genetics consultation at the request of Dr. Hassell Done due to a personal and family history of schwannomas.  Eric Hansen presents to clinic today to discuss the possibility of a hereditary predisposition to schwannomas, genetic testing, and to further clarify his future development of schwannomas risks, as well as potential schwannoma risks for family members. 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 He was diagnosed with two additional schwannomas in 2009 and another in 2016. His brother has been seen by genetics in the past, but it is unclear if he had genetic testing.  CANCER HISTORY:   No history exists.     RISK FACTORS:  Vestibular schwannoma: No FH of vestibular schwannoma: No Previous genetic testing: No   Past Medical History  Diagnosis Date  . Neurofibromatosis   . Chickenpox     Past Surgical History  Procedure Laterality Date  . Biopsy abdomen  2006  . Spinal tumor  2009    Benign  . Wisdom tooth extraction    . Schwannomas      Social History   Social History  . Marital Status: Single    Spouse Name: N/A  . Number of Children: 0  . 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 Smoking status: Former Smoker -- 1.00 packs/day for 3 years    Types: Cigarettes    Quit date: 09/30/2003  . Smokeless tobacco: Never Used  . Alcohol Use: No  . Drug Use: Yes     Comment: rare marijuana  . Sexual Activity:    Partners: Female   Other Topics Concern  . None   Social History Narrative     FAMILY HISTORY:  We obtained a detailed, 4-generation family history.   Significant diagnoses are listed below: Family History  Problem Relation Age of Onset  . Diverticulitis Mother     Living  . Thyroid cancer Mother     dx in her 52s  . Obesity Father     Living  . Other Father   . Neurofibromatosis Father     multiple schwannoma  . Neurofibromatosis Brother     multiple schwannomas  . Neurofibromatosis Paternal Uncle   . Alzheimer's disease Maternal Grandmother   . Neurofibromatosis Paternal Grandfather   . Neurofibromatosis Paternal Aunt   . Neurofibromatosis Cousin     paternal first cousin/Paternal   The patient was diagnosed with Neurofibromatosis in 2006 after having his first schwannoma. He has one biological brother and an adopted brother.  His biological brother was diagnosed with his first schwannoma in 2005 and had 4-5 surgeries.  At that time he was referred to genetics for an evaluation, but he cannot remember whether he had genetic testing.  His mother was diagnosed with thyroid cancer in her 58s.  He does not know the type of thyroid cancer.  She had one brother who died at an known age.  Eric Hansen maternal grandparents died of old age, but his grandmother died of alzheimer's disease.  [name] father had a couple "fatty tumors" cut off  the base of his neck in the 90s, but has never received a diagnosis of NF2.  He had two sisters and two brothers.  One brother died in a MVA in his 60s, and one sister does not have tumors.  One sister and one brother, and one of each of their children, have "fatty tumors".  Eric Hansen paternal grandfather died in his 76s-90s, but may have had fatty tumors as well.  Patient's maternal ancestors are of [place], [place] and [place] descent, and paternal ancestors are of [place] descent. There is no reported Ashkenazi Jewish ancestry. There is no known consanguinity.  GENETIC COUNSELING ASSESSMENT: Eric Hansen is a 39 y.o. male with a personal and family history of schwannomas which somewhat suggestive of  Neurofibromatosis, type 2 or another schwannomatosis syndrome. We, therefore, discussed and recommended the following at today's visit.   DISCUSSION: We discussed that the most common cause of schwannomas is the result of Neurofibromatosis, type 2.  We discussed the difference between NF1 and NF2 and that these are two different conditions.  About 93% of individuals with multiple schwannomas test positive for a mutation in the NF2 gene.  Individuals with NF2 can develop acoustic neuromas.  Nobody in Eric Hansen's family has been diagnosed with an acoustic neuroma, therefore he could be at risk for a mutation in a different gene.  We discussed that there are other genes that are associated with schwannomatosis.  One of these genes, MEN1, is associated with medullary thyroid cancer (MTC).  Eric Hansen mother was diagnosed with thyroid cancer.  Most people have papillary thyroid cancer, however, if she had MTC then he could be at risk for MEN1. Lastly, families who have schwannomatosis without acoustic schwannomas are at risk for SMARCB1 mutations.    We reviewed the characteristics, features and inheritance patterns of hereditary cancer syndromes. We also discussed genetic testing, including the appropriate family members to test, the process of testing, insurance coverage and turn-around-time for results. We discussed the implications of a negative, positive and/or variant of uncertain significant result. We recommended Eric Hansen pursue genetic testing for a custom Schwannoma gene panel.  The custom schwannoma gene panel through [facility] analyzes genes through sequencing and deletion/duplication testing of the following five genes:  MEN1, NF1, NF2, PRKAR1A, and SMARCB1.   Based on Eric Hansen's personal and family history of schwannoma, he meets medical criteria for genetic testing. Despite that he meets criteria, he may still have an out of pocket cost. We discussed that if his out of pocket cost for testing is  over $100, the laboratory will call and confirm whether he wants to proceed with testing.  If the out of pocket cost of testing is less than $100 he will be billed by the genetic testing laboratory.   PLAN: After considering the risks, benefits, and limitations, Mr. Marano  provided informed consent to pursue genetic testing and the blood sample was sent to [facility] for analysis of the Schwannoma gene panel. Results should be available within approximately 2-3 weeks' time, at which point they will be disclosed by telephone to Mr. Rosenbloom, as will any additional recommendations warranted by these results. Mr. Groft will receive a summary of his genetic counseling visit and a copy of his results once available. This information will also be available in Epic. We encouraged Mr. Siddique to remain in contact with cancer genetics annually so that we can continuously update the family history and inform him of any changes in cancer genetics and testing that  may be of benefit for his family. Mr. Arata questions were answered to his satisfaction today. Our contact information was provided should additional questions or concerns arise.  Lastly, we encouraged Mr. Corradi to remain in contact with cancer genetics annually so that we can continuously update the family history and inform him of any changes in cancer genetics and testing that may be of benefit for this family.   Mr.  Gains questions were answered to his satisfaction today. Our contact information was provided should additional questions or concerns arise. Thank you for the referral and allowing [place] to share in the care of your patient.   Karen P. Florene Glen, [place], [facility] Certified Genetic Counselor [email or web address] phone: [phone]  The patient was seen for a total of 60 minutes in face-to-face genetic counseling.  This patient was discussed with Drs. Magrinat, Lindi Adie and/or Burr Medico who agrees with the above.     _______________________________________________________________________ For Office Staff:  Number of people involved in session: 1 Was an Intern/ student involved with case: no

## 2015-05-25 NOTE — Progress Notes (Signed)
Enlarging schwannomas at L1 and L2  Pain on a scale of 0-10 is: pain is well managed with Lyrica 150 mg 3 times daily   If Spine Met(s), symptoms, if any, include:  Bowel/Bladder retention or incontinence (please describe): No  Numbness or weakness in extremities (please describe): No  Current Decadron regimen, if applicable: No  Ambulatory status? Walker? Wheelchair?: Ambulatory  SAFETY ISSUES:  Prior radiation? no  Pacemaker/ICD? no  Possible current pregnancy? no  Is the patient on methotrexate? no  Current Complaints / other details:  39 year old male. Dr. Vertell Limber referred patient to Dr. Tammi Klippel for consideration of SRS for progressively enlarging lesions. Otherwise he plans to hold off on other interventions and continue with biannual imaging.

## 2015-05-25 NOTE — Progress Notes (Signed)
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 Schwannoma 215.9 D36.10    HISTORY OF PRESENT ILLNESS::Eric Hansen is a 39 y.o. male who is here in regards to his schwannomas. Patient presumptively diagnosed with neurofibromatosis in 2006 presenting with enlarging biopsy proven schwannomas. These were associated with pain between right leg and right groin. His brother was diagnosed with NF about one year before that. The patient has not been genetically tested. His brother has been genetically tested. Dr. Vertell Limber removed a tumor from his spine on 2009. He has been taking Lyrica for the pain since 2009.  On follow-up, the patient has had multifocal tumors in the right L1 paraspinal region exending into the retroperitoneum which have been enlarging.  The patient previously saw Dr. Army Chaco at [facility] in 2007 who discussed stereotactic radiotherapy.  Now that the patient is progressing, he has kindly been referred for possible radiotherapy.   PREVIOUS RADIATION THERAPY: No  Past Medical History  Diagnosis Date  . Neurofibromatosis   . Chickenpox   :   Past Surgical History  Procedure Laterality Date  . Biopsy abdomen  2006  . Spinal tumor  2009    Benign  . Wisdom tooth extraction    . Schwannomas    :   Current outpatient prescriptions:  .  HYDROcodone-acetaminophen (NORCO) 7.5-325 MG per tablet, Take 1 tablet by mouth every 6 (six) hours as needed for moderate pain., Disp: , Rfl:  .  ibuprofen (ADVIL,MOTRIN) 200 MG tablet, Take 200 mg by mouth every 6 (six) hours as needed., Disp: , Rfl:  .  pregabalin (LYRICA) 150 MG capsule, Take 150 mg by mouth 3 (three) times daily., Disp:  , Rfl: :   Allergies  Allergen Reactions  . Morphine And Related Other (See Comments)  :   Family History  Problem Relation Age of Onset  . Diverticulitis Mother     Living  . Thyroid cancer Mother     dx in her 56s  . Obesity Father     Living  . Other Father   . Neurofibromatosis Father     multiple schwannoma  . Neurofibromatosis Brother     multiple schwannomas  . Neurofibromatosis Paternal Uncle   . Alzheimer's disease Maternal Grandmother   . Neurofibromatosis Paternal Grandfather   . Neurofibromatosis Paternal Aunt   . Neurofibromatosis Cousin     paternal first cousin/Paternal  :   Social History   Social History  . Marital Status: Single    Spouse Name: N/A  . Number of Children: 0  . 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 Smoking status: Former Smoker -- 1.00 packs/day for 3 years    Types: Cigarettes    Quit date: 09/30/2003  . Smokeless tobacco: Never Used  . Alcohol Use: No  . Drug Use: Yes     Comment: rare marijuana  . Sexual Activity:    Partners: Female   Other Topics Concern  . Not on file   Social History Narrative  :  REVIEW OF SYSTEMS:  A 15 point review of systems is documented in the  electronic medical record. This was obtained by the nursing staff. However, I reviewed this with the patient to discuss relevant findings and make appropriate changes.  Pertinent items are noted in HPI.   Vitals stable. Denies prior hx of radiation therapy. Reports that he works at a [employer]. Reports he is engaged to be married. Reports pain in his lumbar spine that radiates to his right groin.   Explains the pain is a 6 on a scale of 0-10 and feels like he is being stuck with a flaming sword. Explains that occasionally his right knee "will give out and just not work." causing him to fall. No edema noted in bilateral lower extremities. Explains his uncle, brother and paternal cousins  have all been diagnosed with this same disorder, neurofibromatosis. Patient recorded our visit due to fiance being unable to be here. His groin and lumbar pain are worsening. Oftentimes when he eats, he feels pain pressing behind his stomach. Denies erectile dysfunction. Reports intermittent difficulty beginning urinary stream. Eric Hansen has a conference 8/30-9/3 in [place].       PHYSICAL EXAM:  Blood pressure 116/76, pulse 94, resp. rate 16, height 6' (1.829 m), weight 245 lb 9.6 oz (111.403 kg). NAD, Localizes pain to the groin  KPS = 90  100 - Normal; no complaints; no evidence of disease. 90   - Able to carry on normal activity; minor signs or symptoms of disease. 80   - Normal activity with effort; some signs or symptoms of disease. 41   - Cares for self; unable to carry on normal activity or to do active work. 60   - Requires occasional assistance, but is able to care for most of his personal needs. 50   - Requires considerable assistance and frequent medical care. 46   - Disabled; requires special care and assistance. 81   - Severely disabled; hospital admission is indicated although death not imminent. 44   - Very sick; hospital admission necessary; active supportive treatment necessary. 10   - Moribund; fatal processes progressing rapidly. 0     - Dead  Karnofsky DA, Abelmann Bureau, Craver LS and Burchenal [facility] [phone]) The use of the nitrogen mustards in the palliative treatment of carcinoma: with particular reference to bronchogenic carcinoma Cancer 1 634-56  LABORATORY DATA:  Lab Results  Component Value Date   WBC 8.4 12/15/2014   HGB 14.7 12/15/2014   HCT 43.3 12/15/2014   MCV 87.8 12/15/2014   PLT 228.0 12/15/2014   Lab Results  Component Value Date   NA 139 12/15/2014   K 4.0 12/15/2014   CL 108 12/15/2014   CO2 25 12/15/2014   Lab Results  Component Value Date   ALT 32 12/15/2014   AST 17 12/15/2014   ALKPHOS 96 12/15/2014   BILITOT 0.3 12/15/2014       RADIOGRAPHY: MRI Reviewed with patient.    IMPRESSION: Eric Hansen is a pleasant 39 yo gentleman with enlarging paraspinal schwannomas. He may benefit from stereotactic body radiotherapy for local control.  PLAN:  Today, I talked to the patient and family about the findings and work-up thus far.  We discussed the natural history of schwannomas and general treatment, highlighting the role of radiotherapy in the management.  We discussed the available radiation techniques, and focused on the details of logistics and delivery.  We reviewed the anticipated acute and late sequelae associated with radiation in this setting.  The patient was encouraged to ask questions that I answered to  the best of my ability.  I filled out a patient counseling form during our discussion including treatment diagrams.  We retained a copy for our records.  The patient would like to proceed with radiation and will be scheduled for CT simulation.  I spent 60 minutes minutes face to face with the patient and more than 50% of that time was spent in counseling and/or coordination of care.  We will plan for treatment pending review of notes from Dr. Brigitte Pulse. Referral to genetic counseling. Follow up in 1 week to re-group.    This document serves as a record of services personally performed by Tyler Pita, MD. It was created on his behalf by Arlyce Harman, a trained medical scribe. The creation of this record is based on the scribe's personal observations and the provider's statements to them. This document has been checked and approved by the attending provider.    ------------------------------------------------  Sheral Apley Tammi Klippel, M.D.

## 2015-05-25 NOTE — Progress Notes (Signed)
Vitals stable. Denies prior hx of radiation therapy. Reports that he works at a [employer]. Reports he is engaged to be married. Reports pain in his lumbar spine that radiation to his right groin. Explains the pain is a 6 on a scale of 0-10 and feels like he is being stuck with a flaming sword. Explains that occasionally his right knee "will give out and just not work." causing him to fall. No edema noted in bilateral lower extremities. Explains his uncle, brother and paternal cousins have all been diagnosed with this same disorder.   BP 116/76 mmHg  Pulse 94  Resp 16  Ht 6' (1.829 m)  Wt 245 lb 9.6 oz (111.403 kg)  BMI 33.30 kg/m2 Wt Readings from Last 3 Encounters:  05/25/15 245 lb 9.6 oz (111.403 kg)  12/15/14 246 lb (111.585 kg)  11/19/14 239 lb 4 oz (108.523 kg)

## 2015-05-25 NOTE — Progress Notes (Signed)
See progress note under physician encounter. 

## 2015-05-28 NOTE — Addendum Note (Signed)
Encounter addended by: Tyler Pita, MD on: 05/28/2015  8:47 AM<BR>     Documentation filed: Follow-up Section

## 2015-06-05 ENCOUNTER — Ambulatory Visit
Admission: RE | Admit: 2015-06-05 | Discharge: 2015-06-05 | Disposition: A | Discharge status: Home / Self Care | Payer: BLUE CROSS/BLUE SHIELD | Source: Ambulatory Visit | Attending: Radiation Oncology | Admitting: Radiation Oncology

## 2015-06-05 ENCOUNTER — Ambulatory Visit: Payer: BLUE CROSS/BLUE SHIELD

## 2015-06-05 ENCOUNTER — Encounter: Payer: Self-pay | Admitting: Radiation Oncology

## 2015-06-05 VITALS — BP 131/97 | HR 86 | Resp 16 | Wt 246.4 lb

## 2015-06-05 DIAGNOSIS — C479 Malignant neoplasm of peripheral nerves and autonomic nervous system, unspecified: Secondary | ICD-10-CM | POA: Diagnosis not present

## 2015-06-05 DIAGNOSIS — D3610 Benign neoplasm of peripheral nerves and autonomic nervous system, unspecified: Secondary | ICD-10-CM

## 2015-06-05 NOTE — Progress Notes (Signed)
  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 Schwannoma 215.9 D36.10     Narrative:  The patient returns today for routine follow-up.Weight and vitals stable. Reports low back, right hip and right groin pain 5 on a scale of 0-10. Reports today pain is such that he needs to use his cane. No edema of lower extremities noted. Patient without any other complaints.                                ALLERGIES:  is allergic to morphine and related.  Meds: Current Outpatient Prescriptions  Medication Sig Dispense Refill  . HYDROcodone-acetaminophen (NORCO) 7.5-325 MG per tablet Take 1 tablet by mouth every 6 (six) hours as needed for moderate pain.    Marland Kitchen ibuprofen (ADVIL,MOTRIN) 200 MG tablet Take 200 mg by mouth every 6 (six) hours as needed.    . pregabalin (LYRICA) 150 MG capsule Take 150 mg by mouth 3 (three) times daily.     No current facility-administered medications for this encounter.    Physical Findings: The patient is in no acute distress. Patient is alert and oriented.  weight is 246 lb 6.4 oz (111.766 kg). His blood pressure is 131/97 and his pulse is 86. His respiration is 16 and oxygen saturation is 100%. .  No significant changes.  Lab Findings: Lab Results  Component Value Date   WBC 8.4 12/15/2014   HGB 14.7 12/15/2014   HCT 43.3 12/15/2014   PLT 228.0 12/15/2014    Lab Results  Component Value Date   NA 139 12/15/2014   K 4.0 12/15/2014   CO2 25 12/15/2014   GLUCOSE 94 12/15/2014   BUN 20 12/15/2014   CREATININE 0.82 12/15/2014   BILITOT 0.3 12/15/2014   ALKPHOS 96 12/15/2014   AST 17 12/15/2014   ALT 32 12/15/2014   PROT 7.4 12/15/2014   ALBUMIN 4.5  12/15/2014   CALCIUM 9.5 12/15/2014    Impression:  Eric Hansen is a pleasant 39 yo gentleman with enlarging paraspinal schwannomas. He may at some point benefit from stereotactic body radiotherapy for local control.  At this time, after discussing case with multiple colleagues, it was felt that the risks of radiotherapy are not outweighed by the benefits.  Plan:  I will talk to Dr. Vertell Limber about future treatment plans and imaging scheduling.  We may continue with annual MRI and participate in follow-up through the brain and spine clinic.  This document serves as a record of services personally performed by Tyler Pita, MD. It was created on his behalf by Janace Hoard, a trained medical scribe. The creation of this record is based on the scribe's personal observations and the provider's statements to them. This document has been checked and approved by the attending provider.   _____________________________________  Sheral Apley. Tammi Klippel, M.D.

## 2015-06-05 NOTE — Progress Notes (Signed)
Weight and vitals stable. Reports low back, right hip and right groin pain 5 on a scale of 0-10. Reports today pain is such that he needs to use his cane. No edema of lower extremities noted. Patient without any other complaints.   BP 131/97 mmHg  Pulse 86  Resp 16  Wt 246 lb 6.4 oz (111.766 kg)  SpO2 100% Wt Readings from Last 3 Encounters:  06/05/15 246 lb 6.4 oz (111.766 kg)  05/25/15 245 lb 9.6 oz (111.403 kg)  12/15/14 246 lb (111.585 kg)

## 2015-06-05 NOTE — Progress Notes (Signed)
See progress noted under physician encounter.  

## 2015-06-08 NOTE — Addendum Note (Signed)
Encounter addended by: Heywood Footman, RN on: 06/08/2015  9:19 AM<BR>     Documentation filed: Charges VN

## 2015-06-10 ENCOUNTER — Encounter: Payer: Self-pay | Admitting: Radiation Oncology

## 2015-06-10 NOTE — Progress Notes (Signed)
  Radiation Oncology         [phone] ________________________________  Name: Eric Hansen  MRN: [id number]  Date: 06/10/2015  DOB: 08/16/1976  Chart Note:  Genetic testing is complete.  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 Tammi Klippel, M.D.

## 2015-06-11 ENCOUNTER — Encounter (HOSPITAL_COMMUNITY): Payer: Self-pay

## 2015-06-11 ENCOUNTER — Encounter: Payer: Self-pay | Admitting: Genetic Counselor

## 2015-06-11 ENCOUNTER — Telehealth: Payer: Self-pay | Admitting: Genetic Counselor

## 2015-06-11 DIAGNOSIS — Q8503 Schwannomatosis: Secondary | ICD-10-CM | POA: Insufficient documentation

## 2015-06-11 DIAGNOSIS — Z1379 Encounter for other screening for genetic and chromosomal anomalies: Secondary | ICD-10-CM | POA: Insufficient documentation

## 2015-06-11 NOTE — Telephone Encounter (Signed)
LM on VM stating that genetic test results have come back and to please CB.  Left CB instructions.

## 2015-06-12 ENCOUNTER — Telehealth: Payer: Self-pay | Admitting: Genetic Counselor

## 2015-06-12 NOTE — Telephone Encounter (Signed)
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  This is typically associated with the development of schwannomas.  Discussed coming back in for genetic counseling regarding this condition, and bringing his brother and father in as well.  He will contact his family and discuss and call back next week.

## 2015-06-17 ENCOUNTER — Telehealth: Payer: Self-pay | Admitting: Genetic Counselor

## 2015-06-17 NOTE — Telephone Encounter (Signed)
Spoke with Collier Salina to set up f/u appointment.  He just got back into town last night.  He will CB on Friday after talking with family members about this genetic test results.

## 2015-07-07 ENCOUNTER — Encounter: Payer: Self-pay | Admitting: Genetic Counselor

## 2015-07-17 ENCOUNTER — Encounter (HOSPITAL_COMMUNITY): Payer: Self-pay

## 2015-09-14 ENCOUNTER — Encounter: Payer: Self-pay | Admitting: Physician Assistant

## 2015-09-14 ENCOUNTER — Ambulatory Visit (INDEPENDENT_AMBULATORY_CARE_PROVIDER_SITE_OTHER): Payer: BLUE CROSS/BLUE SHIELD | Admitting: Physician Assistant

## 2015-09-14 VITALS — BP 110/72 | HR 81 | Temp 97.9°F | Ht 70.5 in | Wt 247.8 lb

## 2015-09-14 DIAGNOSIS — J00 Acute nasopharyngitis [common cold]: Secondary | ICD-10-CM | POA: Diagnosis not present

## 2015-09-14 DIAGNOSIS — B9689 Other specified bacterial agents as the cause of diseases classified elsewhere: Secondary | ICD-10-CM

## 2015-09-14 DIAGNOSIS — J208 Acute bronchitis due to other specified organisms: Principal | ICD-10-CM

## 2015-09-14 MED ORDER — BENZONATATE 100 MG PO CAPS: 100.0000 mg | ORAL_CAPSULE | Freq: Three times a day (TID) | ORAL | Status: DC | PRN

## 2015-09-14 MED ORDER — AZITHROMYCIN 250 MG PO TABS: ORAL_TABLET | ORAL | Status: DC

## 2015-09-14 MED ORDER — ALBUTEROL SULFATE (2.5 MG/3ML) 0.083% IN NEBU
2.5000 mg | INHALATION_SOLUTION | Freq: Once | RESPIRATORY_TRACT | Status: AC
  Administered 2015-09-14: 2.5 mg via RESPIRATORY_TRACT

## 2015-09-14 MED ORDER — ALBUTEROL SULFATE HFA 108 (90 BASE) MCG/ACT IN AERS: 2.0000 | INHALATION_SPRAY | Freq: Four times a day (QID) | RESPIRATORY_TRACT | Status: DC | PRN

## 2015-09-14 NOTE — Progress Notes (Signed)
Patient presents to clinic today c/o chest congestion with productive cough over the past 1.5 weeks. Endorses symptoms improved after Day 2 but on Day 4 acutely worsened. Denies fever but endorses fatigue and chills. Cough is productive of green-brown sputum. Is having wheezing despite history negative for asthma. Is not a smoker but has second-hand exposure to cigarette smoking.   Past Medical History  Diagnosis Date  . Neurofibromatosis ([place])   . Chickenpox     Current Outpatient Prescriptions on File Prior to Visit  Medication Sig Dispense Refill  . HYDROcodone-acetaminophen (NORCO) 7.5-325 MG per tablet Take 1 tablet by mouth every 6 (six) hours as needed for moderate pain.    Marland Kitchen ibuprofen (ADVIL,MOTRIN) 200 MG tablet Take 200 mg by mouth every 6 (six) hours as needed.    . pregabalin (LYRICA) 150 MG capsule Take 150 mg by mouth 3 (three) times daily.     No current facility-administered medications on file prior to visit.    Allergies  Allergen Reactions  . Morphine And Related Other (See Comments)    Family History  Problem Relation Age of Onset  . Diverticulitis Mother     Living  . Thyroid cancer Mother     dx in her 33s  . Obesity Father     Living  . Other Father   . Neurofibromatosis Father     multiple schwannoma  . Neurofibromatosis Brother     multiple schwannomas  . Neurofibromatosis Paternal Uncle   . Alzheimer's disease Maternal Grandmother   . Neurofibromatosis Paternal Grandfather   . Neurofibromatosis Paternal Aunt   . Neurofibromatosis Cousin     paternal first cousin/Paternal    Social History   Social History  . Marital Status: Single    Spouse Name: N/A  . Number of Children: 0  . 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 Smoking status: Former Smoker -- 1.00 packs/day for 3 years    Types: Cigarettes    Quit date: 09/30/2003  . Smokeless tobacco: Never Used  . Alcohol  Use: No  . Drug Use: Yes     Comment: rare marijuana  . Sexual Activity:    Partners: Female   Other Topics Concern  . None   Social History Narrative   Review of Systems - See HPI.  All other ROS are negative.  BP 110/72 mmHg  Pulse 81  Temp(Src) 97.9 F (36.6 C) (Oral)  Ht 5' 10.5" (1.791 m)  Wt 247 lb 12.8 oz (112.401 kg)  BMI 35.04 kg/m2  SpO2 97%  Physical Exam  Constitutional: He is oriented to person, place, and time and well-developed, well-nourished, and in no distress.  HENT:  Head: Normocephalic and atraumatic.  Right Ear: External ear normal.  Left Ear: External ear normal.  Nose: Nose normal.  Mouth/Throat: Oropharynx is clear and moist. No oropharyngeal exudate.  TM within normal limits bilaterally.  Eyes: Conjunctivae are normal.  Neck: Neck supple.  Cardiovascular: Normal rate, regular rhythm, normal heart sounds and intact distal pulses.   Pulmonary/Chest: Effort normal. No respiratory distress. He has wheezes. He has no rales. He exhibits no tenderness.  Neurological: He is alert and oriented to person, place, and time.  Skin: Skin is warm and dry. No rash noted.  Psychiatric: Affect normal.  Vitals reviewed.   No results found for this or any previous visit (from the past 2160 hour(s)).  Assessment/Plan: Acute  bacterial bronchitis Rx Azithromycin.  Increase fluids.  Rest.  Saline nasal spray.  Probiotic.  Mucinex as directed.  Humidifier in bedroom. Tessalon and Albuterol per orders.  Call or return to clinic if symptoms are not improving.

## 2015-09-14 NOTE — Assessment & Plan Note (Signed)
Rx Azithromycin.  Increase fluids.  Rest.  Saline nasal spray.  Probiotic.  Mucinex as directed.  Humidifier in bedroom. Tessalon and Albuterol per orders.  Call or return to clinic if symptoms are not improving.

## 2015-09-14 NOTE — Patient Instructions (Signed)
Take antibiotic (Azithromycin) as directed.  Increase fluids.  Get plenty of rest. Use Mucinex for congestion. Use Tessalon as directed for cough and Albuterol as directed for wheeze. Take a daily probiotic (I recommend Align or Culturelle, but even Activia Yogurt may be beneficial).  A humidifier placed in the bedroom may offer some relief for a dry, scratchy throat of nasal irritation.  Read information below on acute bronchitis. Please call or return to clinic if symptoms are not improving.  Acute Bronchitis Bronchitis is when the airways that extend from the windpipe into the lungs get red, puffy, and painful (inflamed). Bronchitis often causes thick spit (mucus) to develop. This leads to a cough. A cough is the most common symptom of bronchitis. In acute bronchitis, the condition usually begins suddenly and goes away over time (usually in 2 weeks). Smoking, allergies, and asthma can make bronchitis worse. Repeated episodes of bronchitis may cause more lung problems.  HOME CARE 1. Rest. 2. Drink enough fluids to keep your pee (urine) clear or pale yellow (unless you need to limit fluids as told by your doctor). 3. Only take over-the-counter or prescription medicines as told by your doctor. 4. Avoid smoking and secondhand smoke. These can make bronchitis worse. If you are a smoker, think about using nicotine gum or skin patches. Quitting smoking will help your lungs heal faster. 5. Reduce the chance of getting bronchitis again by: 1. Washing your hands often. 2. Avoiding people with cold symptoms. 3. Trying not to touch your hands to your mouth, nose, or eyes. 6. Follow up with your doctor as told.  GET HELP IF: Your symptoms do not improve after 1 week of treatment. Symptoms include:  Cough.  Fever.  Coughing up thick spit.  Body aches.  Chest congestion.  Chills.  Shortness of breath.  Sore throat.  GET HELP RIGHT AWAY IF:   You have an increased fever.  You have  chills.  You have severe shortness of breath.  You have bloody thick spit (sputum).  You throw up (vomit) often.  You lose too much body fluid (dehydration).  You have a severe headache.  You faint.  MAKE SURE YOU:   Understand these instructions.  Will watch your condition.  Will get help right away if you are not doing well or get worse. Document Released: 03/14/2008 Document Revised: 05/29/2013 Document Reviewed: 03/19/2013 [facility] Patient Information 2015 [place], [place]. This information is not intended to replace advice given to you by your health care provider. Make sure you discuss any questions you have with your health care provider.  Metered Dose Inhaler (No Spacer Used) Inhaled medicines are the basis of treatment for asthma and other breathing problems. Inhaled medicine can only be effective if used properly. Good technique assures that the medicine reaches the lungs. Metered dose inhalers (MDIs) are used to deliver a variety of inhaled medicines. These include quick relief or rescue medicines (such as bronchodilators) and controller medicines (such as corticosteroids). The medicine is delivered by pushing down on a metal canister to release a set amount of spray. If you are using different kinds of inhalers, use your quick relief medicine to open the airways 10-15 minutes before using a steroid, if instructed to do so by your health care provider. If you are unsure which inhalers to use and the order of using them, ask your health care provider, nurse, or respiratory therapist. HOW TO USE THE INHALER 7. Remove the cap from the inhaler. 8. If you are using  the inhaler for the first time, you will need to prime it. Shake the inhaler for 5 seconds and release four puffs into the air, away from your face. Ask your health care provider or pharmacist if you have questions about priming your inhaler. 9. Shake the inhaler for 5 seconds before each breath in  (inhalation). 10. Position the inhaler so that the top of the canister faces up. 11. Put your index finger on the top of the medicine canister. Your thumb supports the bottom of the inhaler. 12. Open your mouth. 13. Either place the inhaler between your teeth and place your lips tightly around the mouthpiece, or hold the inhaler 1-2 inches away from your open mouth. If you are unsure of which technique to use, ask your health care provider. 14. Breathe out (exhale) normally and as completely as possible. 15. Press the canister down with the index finger to release the medicine. 16. At the same time as the canister is pressed, inhale deeply and slowly until your lungs are completely filled. This should take 4-6 seconds. Keep your tongue down. 17. Hold the medicine in your lungs for 5-10 seconds (10 seconds is best). This helps the medicine get into the small airways of your lungs. 18. Breathe out slowly, through pursed lips. Whistling is an example of pursed lips. 19. Wait at least 1 minute between puffs. Continue with the above steps until you have taken the number of puffs your health care provider has ordered. Do not use the inhaler more than your health care provider directs you to. 20. Replace the cap on the inhaler. 21. Follow the directions from your health care provider or the inhaler insert for cleaning the inhaler. If you are using a steroid inhaler, after your last puff, rinse your mouth with water, gargle, and spit out the water. Do not swallow the water. AVOID:  Inhaling before or after starting the spray of medicine. It takes practice to coordinate your breathing with triggering the spray.  Inhaling through the nose (rather than the mouth) when triggering the spray. HOW TO DETERMINE IF YOUR INHALER IS FULL OR NEARLY EMPTY You cannot know when an inhaler is empty by shaking it. Some inhalers are now being made with dose counters. Ask your health care provider for a prescription that  has a dose counter if you feel you need that extra help. If your inhaler does not have a counter, ask your health care provider to help you determine the date you need to refill your inhaler. Write the refill date on a calendar or your inhaler canister. Refill your inhaler 7-10 days before it runs out. Be sure to keep an adequate supply of medicine. This includes making sure it has not expired, and making sure you have a spare inhaler. SEEK MEDICAL CARE IF:  Symptoms are only partially relieved with your inhaler.  You are having trouble using your inhaler.  You experience an increase in phlegm. SEEK IMMEDIATE MEDICAL CARE IF:  You feel little or no relief with your inhalers. You are still wheezing and feeling shortness of breath, tightness in your chest, or both.  You have dizziness, headaches, or a fast heart rate.  You have chills, fever, or night sweats.  There is a noticeable increase in phlegm production, or there is blood in the phlegm. MAKE SURE YOU:  Understand these instructions.  Will watch your condition.  Will get help right away if you are not doing well or get worse.   This information  is not intended to replace advice given to you by your health care provider. Make sure you discuss any questions you have with your health care provider.   Document Released: 07/24/2007 Document Revised: 10/17/2014 Document Reviewed: 03/14/2013 Elsevier Interactive Patient Education [employer].

## 2015-09-14 NOTE — Addendum Note (Signed)
Addended by: Harl Bowie on: 09/14/2015 12:07 PM   Modules accepted: Orders

## 2015-09-14 NOTE — Progress Notes (Signed)
Pre visit review using our clinic review tool, if applicable. No additional management support is needed unless otherwise documented below in the visit note. 

## 2016-04-07 DIAGNOSIS — M4806 Spinal stenosis, lumbar region: Secondary | ICD-10-CM | POA: Diagnosis not present

## 2016-04-07 DIAGNOSIS — G8928 Other chronic postprocedural pain: Secondary | ICD-10-CM | POA: Diagnosis not present

## 2016-04-07 DIAGNOSIS — M5416 Radiculopathy, lumbar region: Secondary | ICD-10-CM | POA: Diagnosis not present

## 2016-04-07 DIAGNOSIS — D3610 Benign neoplasm of peripheral nerves and autonomic nervous system, unspecified: Secondary | ICD-10-CM | POA: Diagnosis not present

## 2016-07-08 DIAGNOSIS — M5416 Radiculopathy, lumbar region: Secondary | ICD-10-CM | POA: Diagnosis not present

## 2016-07-08 DIAGNOSIS — G8928 Other chronic postprocedural pain: Secondary | ICD-10-CM | POA: Diagnosis not present

## 2016-07-08 DIAGNOSIS — M4806 Spinal stenosis, lumbar region: Secondary | ICD-10-CM | POA: Diagnosis not present

## 2016-08-02 DIAGNOSIS — Z85828 Personal history of other malignant neoplasm of skin: Secondary | ICD-10-CM | POA: Diagnosis not present

## 2016-08-02 DIAGNOSIS — D2262 Melanocytic nevi of left upper limb, including shoulder: Secondary | ICD-10-CM | POA: Diagnosis not present

## 2016-08-02 DIAGNOSIS — D225 Melanocytic nevi of trunk: Secondary | ICD-10-CM | POA: Diagnosis not present

## 2016-08-02 DIAGNOSIS — L308 Other specified dermatitis: Secondary | ICD-10-CM | POA: Diagnosis not present

## 2016-08-02 DIAGNOSIS — L570 Actinic keratosis: Secondary | ICD-10-CM | POA: Diagnosis not present

## 2016-09-22 DIAGNOSIS — M961 Postlaminectomy syndrome, not elsewhere classified: Secondary | ICD-10-CM | POA: Diagnosis not present

## 2016-09-22 DIAGNOSIS — Q8500 Neurofibromatosis, unspecified: Secondary | ICD-10-CM | POA: Diagnosis not present

## 2016-09-22 DIAGNOSIS — I10 Essential (primary) hypertension: Secondary | ICD-10-CM | POA: Diagnosis not present

## 2016-09-22 DIAGNOSIS — M5416 Radiculopathy, lumbar region: Secondary | ICD-10-CM | POA: Diagnosis not present

## 2016-11-25 ENCOUNTER — Encounter (HOSPITAL_COMMUNITY): Payer: Self-pay

## 2016-12-06 ENCOUNTER — Encounter: Payer: Self-pay | Admitting: Physician Assistant

## 2016-12-06 ENCOUNTER — Ambulatory Visit (INDEPENDENT_AMBULATORY_CARE_PROVIDER_SITE_OTHER): Payer: BLUE CROSS/BLUE SHIELD | Admitting: Physician Assistant

## 2016-12-06 VITALS — BP 122/72 | HR 77 | Temp 97.9°F | Resp 14 | Ht 70.5 in | Wt 228.0 lb

## 2016-12-06 DIAGNOSIS — J020 Streptococcal pharyngitis: Secondary | ICD-10-CM | POA: Diagnosis not present

## 2016-12-06 MED ORDER — AMOXICILLIN 875 MG PO TABS
875.0000 mg | ORAL_TABLET | Freq: Two times a day (BID) | ORAL | 0 refills | Status: DC
Start: 2016-12-06 — End: 2017-04-17

## 2016-12-06 NOTE — Patient Instructions (Signed)
Please take the antibiotic as directed. Increase fluids and get plenty of rest.  Tylenol if needed for fever or aches. Symptoms should gradually resolve. Salt-water gargles will be helpful. Warm liquids (tea, etc) to calm sore throat.   Strep Throat Strep throat is a bacterial infection of the throat. Your health care provider may call the infection tonsillitis or pharyngitis, depending on whether there is swelling in the tonsils or at the back of the throat. Strep throat is most common during the cold months of the year in children who are 15-4 years of age, but it can happen during any season in people of any age. This infection is spread from person to person (contagious) through coughing, sneezing, or close contact. What are the causes? Strep throat is caused by the bacteria called Streptococcus pyogenes. What increases the risk? This condition is more likely to develop in:  People who spend time in crowded places where the infection can spread easily.  People who have close contact with someone who has strep throat. What are the signs or symptoms? Symptoms of this condition include:  Fever or chills.  Redness, swelling, or pain in the tonsils or throat.  Pain or difficulty when swallowing.  White or yellow spots on the tonsils or throat.  Swollen, tender glands in the neck or under the jaw.  Red rash all over the body (rare). How is this diagnosed? This condition is diagnosed by performing a rapid strep test or by taking a swab of your throat (throat culture test). Results from a rapid strep test are usually ready in a few minutes, but throat culture test results are available after one or two days. How is this treated? This condition is treated with antibiotic medicine. Follow these instructions at home: Medicines   Take over-the-counter and prescription medicines only as told by your health care provider.  Take your antibiotic as told by your health care provider. Do  not stop taking the antibiotic even if you start to feel better.  Have family members who also have a sore throat or fever tested for strep throat. They may need antibiotics if they have the strep infection. Eating and drinking   Do not share food, drinking cups, or personal items that could cause the infection to spread to other people.  If swallowing is difficult, try eating soft foods until your sore throat feels better.  Drink enough fluid to keep your urine clear or pale yellow. General instructions   Gargle with a salt-water mixture 3-4 times per day or as needed. To make a salt-water mixture, completely dissolve -1 tsp of salt in 1 cup of warm water.  Make sure that all household members wash their hands well.  Get plenty of rest.  Stay home from school or work until you have been taking antibiotics for 24 hours.  Keep all follow-up visits as told by your health care provider. This is important. Contact a health care provider if:  The glands in your neck continue to get bigger.  You develop a rash, cough, or earache.  You cough up a thick liquid that is green, yellow-brown, or bloody.  You have pain or discomfort that does not get better with medicine.  Your problems seem to be getting worse rather than better.  You have a fever. Get help right away if:  You have new symptoms, such as vomiting, severe headache, stiff or painful neck, chest pain, or shortness of breath.  You have severe throat pain, drooling, or  changes in your voice.  You have swelling of the neck, or the skin on the neck becomes red and tender.  You have signs of dehydration, such as fatigue, dry mouth, and decreased urination.  You become increasingly sleepy, or you cannot wake up completely.  Your joints become red or painful. This information is not intended to replace advice given to you by your health care provider. Make sure you discuss any questions you have with your health care  provider. Document Released: 09/23/2000 Document Revised: 05/25/2016 Document Reviewed: 01/19/2015 Elsevier Interactive Patient Education  2017 [employer].

## 2016-12-06 NOTE — Progress Notes (Signed)
Pre visit review using our clinic review tool, if applicable. No additional management support is needed unless otherwise documented below in the visit note. 

## 2016-12-06 NOTE — Progress Notes (Signed)
Patient presents to clinic today c/o 2 days of scratchy throat and swollen feeling in throat. Denies chest congestion, cough, nasal congestion, ear pressure/pain. Notes some mild fatigue. Some mild pnd. Denies fever, chills. Recently got back from [place]. Denies sick contact. Has taken Nyquil with some relief in symptoms.   Past Medical History:  Diagnosis Date  . Chickenpox   . Neurofibromatosis [facility])     Current Outpatient Prescriptions on File Prior to Visit  Medication Sig Dispense Refill  . albuterol (PROVENTIL HFA;VENTOLIN HFA) 108 (90 BASE) MCG/ACT inhaler Inhale 2 puffs into the lungs every 6 (six) hours as needed for wheezing or shortness of breath. 1 Inhaler 0  . HYDROcodone-acetaminophen (NORCO) 7.5-325 MG per tablet Take 1 tablet by mouth every 6 (six) hours as needed for moderate pain.    Marland Kitchen ibuprofen (ADVIL,MOTRIN) 200 MG tablet Take 200 mg by mouth every 6 (six) hours as needed.    . pregabalin (LYRICA) 150 MG capsule Take 150 mg by mouth 3 (three) times daily.     No current facility-administered medications on file prior to visit.     Allergies  Allergen Reactions  . Morphine And Related Other (See Comments)    Family History  Problem Relation Age of Onset  . Diverticulitis Mother     Living  . Thyroid cancer Mother     dx in her 2s  . Obesity Father     Living  . Other Father   . Neurofibromatosis Father     multiple schwannoma  . Neurofibromatosis Brother     multiple schwannomas  . Neurofibromatosis Paternal Uncle   . Alzheimer's disease Maternal Grandmother   . Neurofibromatosis Paternal Grandfather   . Neurofibromatosis Paternal Aunt   . Neurofibromatosis Cousin     paternal first cousin/Paternal    Social History   Social History  . Marital status: Single    Spouse name: N/A  . Number of children: 0  . 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 Smoking status: Former Smoker      Packs/day: 1.00    Years: 3.00    Types: Cigarettes    Quit date: 09/30/2003  . Smokeless tobacco: Never Used  . Alcohol use No  . Drug use: Yes     Comment: rare marijuana  . Sexual activity: Yes    Partners: Female   Other Topics Concern  . None   Social History Narrative  . None   Review of Systems - See HPI.  All other ROS are negative.  BP 122/72   Pulse 77   Temp 97.9 F (36.6 C) (Oral)   Resp 14   Ht 5' 10.5" (1.791 m)   Wt 228 lb (103.4 kg)   SpO2 97%   BMI 32.25 kg/m   Physical Exam  Constitutional: He is oriented to person, place, and time and well-developed, well-nourished, and in no distress.  HENT:  Head: Normocephalic and atraumatic.  Right Ear: Tympanic membrane normal.  Left Ear: Tympanic membrane normal.  Nose: Nose normal. Right sinus exhibits no maxillary sinus tenderness and no frontal sinus tenderness. Left sinus exhibits no maxillary sinus tenderness and no frontal sinus tenderness.  Mouth/Throat: Uvula is midline and mucous membranes are normal. Posterior oropharyngeal erythema present. No oropharyngeal exudate.  Eyes: Conjunctivae are normal.  Neck: Neck supple.  Cardiovascular: Normal rate, regular rhythm, normal heart sounds and intact distal pulses.  Pulmonary/Chest: Effort normal and breath sounds normal. No respiratory distress. He has no wheezes. He has no rales. He exhibits no tenderness.  Lymphadenopathy:       Head (right side): Tonsillar adenopathy present.       Head (left side): Tonsillar adenopathy present.  Neurological: He is alert and oriented to person, place, and time.  Skin: Skin is warm and dry. No rash noted.  Psychiatric: Affect normal.  Vitals reviewed.  Assessment/Plan: 1. Strep throat Rx Amoxicillin. Supportive measures and OTC medications reviewed. FU discussed. - amoxicillin (AMOXIL) 875 MG tablet; Take 1 tablet (875 mg total) by mouth 2 (two) times daily.  Dispense: 20 tablet; Refill: 0    Leeanne Rio, [place]

## 2016-12-12 ENCOUNTER — Encounter: Payer: Self-pay | Admitting: Physician Assistant

## 2016-12-12 ENCOUNTER — Ambulatory Visit (INDEPENDENT_AMBULATORY_CARE_PROVIDER_SITE_OTHER): Payer: BLUE CROSS/BLUE SHIELD | Admitting: Physician Assistant

## 2016-12-12 VITALS — BP 112/72 | HR 75 | Temp 97.6°F | Resp 14 | Ht 70.5 in | Wt 232.0 lb

## 2016-12-12 DIAGNOSIS — Z0000 Encounter for general adult medical examination without abnormal findings: Secondary | ICD-10-CM

## 2016-12-12 LAB — URINALYSIS, ROUTINE W REFLEX MICROSCOPIC
BILIRUBIN URINE: NEGATIVE
HGB URINE DIPSTICK: NEGATIVE
Ketones, ur: NEGATIVE
LEUKOCYTES UA: NEGATIVE
NITRITE: NEGATIVE
RBC / HPF: NONE SEEN (ref 0–?)
Specific Gravity, Urine: 1.015 (ref 1.000–1.030)
TOTAL PROTEIN, URINE-UPE24: NEGATIVE
Urine Glucose: NEGATIVE
Urobilinogen, UA: 0.2 (ref 0.0–1.0)
WBC UA: NONE SEEN (ref 0–?)
pH: 6.5 (ref 5.0–8.0)

## 2016-12-12 LAB — CBC WITH DIFFERENTIAL/PLATELET
BASOS PCT: 0.3 % (ref 0.0–3.0)
Basophils Absolute: 0 10*3/uL (ref 0.0–0.1)
EOS PCT: 1.5 % (ref 0.0–5.0)
Eosinophils Absolute: 0.1 10*3/uL (ref 0.0–0.7)
HEMATOCRIT: 40.8 % (ref 39.0–52.0)
HEMOGLOBIN: 13.7 g/dL (ref 13.0–17.0)
LYMPHS PCT: 26.6 % (ref 12.0–46.0)
Lymphs Abs: 2.6 10*3/uL (ref 0.7–4.0)
MCHC: 33.6 g/dL (ref 30.0–36.0)
MCV: 89.5 fl (ref 78.0–100.0)
MONOS PCT: 5.4 % (ref 3.0–12.0)
Monocytes Absolute: 0.5 10*3/uL (ref 0.1–1.0)
NEUTROS ABS: 6.5 10*3/uL (ref 1.4–7.7)
Neutrophils Relative %: 66.2 % (ref 43.0–77.0)
PLATELETS: 221 10*3/uL (ref 150.0–400.0)
RBC: 4.56 Mil/uL (ref 4.22–5.81)
RDW: 14.5 % (ref 11.5–15.5)
WBC: 9.8 10*3/uL (ref 4.0–10.5)

## 2016-12-12 LAB — COMPREHENSIVE METABOLIC PANEL
ALT: 20 U/L (ref 0–53)
AST: 14 U/L (ref 0–37)
Albumin: 4.2 g/dL (ref 3.5–5.2)
Alkaline Phosphatase: 85 U/L (ref 39–117)
BUN: 16 mg/dL (ref 6–23)
CHLORIDE: 106 meq/L (ref 96–112)
CO2: 30 mEq/L (ref 19–32)
Calcium: 9.2 mg/dL (ref 8.4–10.5)
Creatinine, Ser: 0.76 mg/dL (ref 0.40–1.50)
GFR: 120.4 mL/min (ref >60.00)
GLUCOSE: 82 mg/dL (ref 70–99)
POTASSIUM: 4.6 meq/L (ref 3.5–5.1)
SODIUM: 142 meq/L (ref 135–145)
Total Bilirubin: 0.4 mg/dL (ref 0.2–1.2)
Total Protein: 6.6 g/dL (ref 6.0–8.3)

## 2016-12-12 LAB — LIPID PANEL
CHOLESTEROL: 183 mg/dL (ref 0–200)
HDL: 40.5 mg/dL (ref >39.00)
LDL CALC: 114 mg/dL — AB (ref 0–99)
NonHDL: 142.28
TRIGLYCERIDES: 143 mg/dL (ref 0.0–149.0)
Total CHOL/HDL Ratio: 5
VLDL: 28.6 mg/dL (ref 0.0–40.0)

## 2016-12-12 LAB — HEMOGLOBIN A1C: Hgb A1c MFr Bld: 5.7 % (ref 4.6–6.5)

## 2016-12-12 NOTE — Progress Notes (Signed)
Pre visit review using our clinic review tool, if applicable. No additional management support is needed unless otherwise documented below in the visit note. 

## 2016-12-12 NOTE — Patient Instructions (Signed)
Please go to the lab for blood work.   Our office will call you with your results unless you have chosen to receive results via MyChart.  If your blood work is normal we will follow-up each year for physicals and as scheduled for chronic medical problems.  If anything is abnormal we will treat accordingly and get you in for a follow-up.    Preventive Care 18-39 Years, Male Preventive care refers to lifestyle choices and visits with your health care provider that can promote health and wellness. What does preventive care include?  A yearly physical exam. This is also called an annual well check.  Dental exams once or twice a year.  Routine eye exams. Ask your health care provider how often you should have your eyes checked.  Personal lifestyle choices, including:  Daily care of your teeth and gums.  Regular physical activity.  Eating a healthy diet.  Avoiding tobacco and drug use.  Limiting alcohol use.  Practicing safe sex. What happens during an annual well check? The services and screenings done by your health care provider during your annual well check will depend on your age, overall health, lifestyle risk factors, and family history of disease. Counseling  Your health care provider may ask you questions about your:  Alcohol use.  Tobacco use.  Drug use.  Emotional well-being.  Home and relationship well-being.  Sexual activity.  Eating habits.  Work and work Statistician. Screening  You may have the following tests or measurements:  Height, weight, and BMI.  Blood pressure.  Lipid and cholesterol levels. These may be checked every 5 years starting at age 33.  Diabetes screening. This is done by checking your blood sugar (glucose) after you have not eaten for a while (fasting).  Skin check.  Hepatitis C blood test.  Hepatitis B blood test.  Sexually transmitted disease (STD) testing. Discuss your test results, treatment options, and if  necessary, the need for more tests with your health care provider. Vaccines  Your health care provider may recommend certain vaccines, such as:  Influenza vaccine. This is recommended every year.  Tetanus, diphtheria, and acellular pertussis (Tdap, Td) vaccine. You may need a Td booster every 10 years.  Varicella vaccine. You may need this if you have not been vaccinated.  HPV vaccine. If you are 32 or younger, you may need three doses over 6 months.  Measles, mumps, and rubella (MMR) vaccine. You may need at least one dose of MMR.You may also need a second dose.  Pneumococcal 13-valent conjugate (PCV13) vaccine. You may need this if you have certain conditions and have not been vaccinated.  Pneumococcal polysaccharide (PPSV23) vaccine. You may need one or two doses if you smoke cigarettes or if you have certain conditions.  Meningococcal vaccine. One dose is recommended if you are age 59-21 years and a first-year college student living in a residence hall, or if you have one of several medical conditions. You may also need additional booster doses.  Hepatitis A vaccine. You may need this if you have certain conditions or if you travel or work in places where you may be exposed to hepatitis A.  Hepatitis B vaccine. You may need this if you have certain conditions or if you travel or work in places where you may be exposed to hepatitis B.  Haemophilus influenzae type b (Hib) vaccine. You may need this if you have certain risk factors. Talk to your health care provider about which screenings and vaccines you need  and how often you need them. This information is not intended to replace advice given to you by your health care provider. Make sure you discuss any questions you have with your health care provider. Document Released: 11/22/2001 Document Revised: 06/15/2016 Document Reviewed: 07/28/2015 Elsevier Interactive Patient Education  2017 [employer].

## 2016-12-12 NOTE — Progress Notes (Signed)
Patient presents to clinic today for annual exam.  Patient is fasting for labs. Body mass index is 32.82 kg/m. Is trying to watch diet. No regular exercise at present but is working on this.   Acute Concerns: Denies acute concerns today.  Health Maintenance: Immunizations -- Declines flu shot. Tetanus up-to-date.    Past Medical History:  Diagnosis Date  . Chickenpox   . 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 biopsy abdomen  2006  . schwannomas    . spinal tumor  2009   Benign  . WISDOM TOOTH EXTRACTION      Current Outpatient Prescriptions on File Prior to Visit  Medication Sig Dispense Refill  . albuterol (PROVENTIL HFA;VENTOLIN HFA) 108 (90 BASE) MCG/ACT inhaler Inhale 2 puffs into the lungs every 6 (six) hours as needed for wheezing or shortness of breath. 1 Inhaler 0  . amoxicillin (AMOXIL) 875 MG tablet Take 1 tablet (875 mg total) by mouth 2 (two) times daily. 20 tablet 0  . HYDROcodone-acetaminophen (NORCO) 7.5-325 MG per tablet Take 1 tablet by mouth every 6 (six) hours as needed for moderate pain.    Marland Kitchen ibuprofen (ADVIL,MOTRIN) 200 MG tablet Take 200 mg by mouth every 6 (six) hours as needed.    . pregabalin (LYRICA) 150 MG capsule Take 150 mg by mouth 3 (three) times daily.     No current facility-administered medications on file prior to visit.     Allergies  Allergen Reactions  . Morphine And Related Other (See Comments)    Family History  Problem Relation Age of Onset  . Diverticulitis Mother     Living  . Thyroid cancer Mother     dx in her 32s  . Obesity Father     Living  . Other Father   . Neurofibromatosis Father     multiple schwannoma  . Neurofibromatosis Brother     multiple schwannomas  . Neurofibromatosis Paternal Uncle   . Alzheimer's disease Maternal Grandmother   . Neurofibromatosis Paternal Grandfather   . Neurofibromatosis Paternal Aunt   . Neurofibromatosis Cousin     paternal first  cousin/Paternal    Social History   Social History  . Marital status: Single    Spouse name: N/A  . Number of children: 0  . 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 Smoking status: Former Smoker    Packs/day: 1.00    Years: 3.00    Types: Cigarettes    Quit date: 09/30/2003  . Smokeless tobacco: Never Used  . Alcohol use No  . Drug use: Yes     Comment: rare marijuana  . Sexual activity: Yes    Partners: Female   Other Topics Concern  . Not on file   Social History Narrative  . No narrative on file   Review of Systems  Constitutional: Negative for fever and weight loss.  HENT: Negative for ear discharge, ear pain, hearing loss and tinnitus.   Eyes: Negative for blurred vision, double vision, photophobia and pain.  Respiratory: Negative for cough and shortness of breath.   Cardiovascular: Negative for chest pain and palpitations.  Gastrointestinal: Negative for abdominal pain, blood in stool, constipation, diarrhea, heartburn, melena, nausea and vomiting.  Genitourinary: Negative for dysuria, flank pain, frequency, hematuria and urgency.  Musculoskeletal: Negative for falls.  Neurological: Negative for dizziness, loss of consciousness and headaches.  Endo/Heme/Allergies: Negative for environmental allergies.  Psychiatric/Behavioral: Negative for depression, hallucinations, substance abuse and suicidal ideas. The patient is not nervous/anxious and does not have insomnia.    BP 112/72   Pulse 75   Temp 97.6 F (36.4 C) (Oral)   Resp 14   Ht 5' 10.5" (1.791 m)   Wt 232 lb (105.2 kg)   SpO2 93%   BMI 32.82 kg/m   Physical Exam  Constitutional: He is oriented to person, place, and time and well-developed, well-nourished, and in no distress.  HENT:  Head: Normocephalic and atraumatic.  Right Ear: External ear normal.  Left Ear: External ear normal.  Nose: Nose normal.  Mouth/Throat: Oropharynx is  clear and moist. No oropharyngeal exudate.  Eyes: Conjunctivae and EOM are normal. Pupils are equal, round, and reactive to light.  Neck: Neck supple. No thyromegaly present.  Cardiovascular: Normal rate, regular rhythm, normal heart sounds and intact distal pulses.   Pulmonary/Chest: Effort normal and breath sounds normal. No respiratory distress. He has no wheezes. He has no rales. He exhibits no tenderness.  Abdominal: Soft. Bowel sounds are normal. He exhibits no distension and no mass. There is no tenderness. There is no rebound and no guarding.  Genitourinary: Testes/scrotum normal and penis normal. No discharge found.  Lymphadenopathy:    He has no cervical adenopathy.  Neurological: He is alert and oriented to person, place, and time.  Skin: Skin is warm and dry. No rash noted.  Psychiatric: Affect normal.  Vitals reviewed.  Assessment/Plan: Visit for preventive health examination Depression screen negative. Health Maintenance reviewed -- Immunizations up-to-date. Appropriate diet and exercise reviewed. Preventive schedule discussed and handout given in AVS. Will obtain fasting labs today.     Leeanne Rio, PA-C

## 2016-12-12 NOTE — Assessment & Plan Note (Signed)
Depression screen negative. Health Maintenance reviewed -- Immunizations up-to-date. Appropriate diet and exercise reviewed. Preventive schedule discussed and handout given in AVS. Will obtain fasting labs today.

## 2016-12-13 ENCOUNTER — Encounter: Payer: Self-pay | Admitting: Emergency Medicine

## 2016-12-19 DIAGNOSIS — Q8500 Neurofibromatosis, unspecified: Secondary | ICD-10-CM | POA: Diagnosis not present

## 2016-12-19 DIAGNOSIS — M961 Postlaminectomy syndrome, not elsewhere classified: Secondary | ICD-10-CM | POA: Diagnosis not present

## 2016-12-19 DIAGNOSIS — R030 Elevated blood-pressure reading, without diagnosis of hypertension: Secondary | ICD-10-CM | POA: Diagnosis not present

## 2016-12-19 DIAGNOSIS — M5416 Radiculopathy, lumbar region: Secondary | ICD-10-CM | POA: Diagnosis not present

## 2017-04-13 DIAGNOSIS — M5416 Radiculopathy, lumbar region: Secondary | ICD-10-CM | POA: Diagnosis not present

## 2017-04-13 DIAGNOSIS — M961 Postlaminectomy syndrome, not elsewhere classified: Secondary | ICD-10-CM | POA: Diagnosis not present

## 2017-04-13 DIAGNOSIS — R030 Elevated blood-pressure reading, without diagnosis of hypertension: Secondary | ICD-10-CM | POA: Diagnosis not present

## 2017-04-13 DIAGNOSIS — Q8500 Neurofibromatosis, unspecified: Secondary | ICD-10-CM | POA: Diagnosis not present

## 2017-04-17 ENCOUNTER — Ambulatory Visit (INDEPENDENT_AMBULATORY_CARE_PROVIDER_SITE_OTHER): Payer: BLUE CROSS/BLUE SHIELD

## 2017-04-17 ENCOUNTER — Ambulatory Visit (INDEPENDENT_AMBULATORY_CARE_PROVIDER_SITE_OTHER): Payer: BLUE CROSS/BLUE SHIELD | Admitting: Physician Assistant

## 2017-04-17 ENCOUNTER — Encounter: Payer: Self-pay | Admitting: Physician Assistant

## 2017-04-17 VITALS — BP 122/84 | HR 96 | Temp 98.3°F | Resp 14 | Ht 70.5 in | Wt 222.0 lb

## 2017-04-17 DIAGNOSIS — S8992XA Unspecified injury of left lower leg, initial encounter: Secondary | ICD-10-CM | POA: Diagnosis not present

## 2017-04-17 DIAGNOSIS — M25562 Pain in left knee: Secondary | ICD-10-CM | POA: Diagnosis not present

## 2017-04-17 NOTE — Progress Notes (Signed)
Patient presents to clinic today c/o 2 weeks of L knee pain. 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 Walks with a can due to balance issue with R knee. Notes turning at work, lost balance and fell and landed on the left knee. No popping at the time.  Notes pain since that time along with swelling. Pain is described as aching and feels a sensation of something in the knee. Does not radiate elsewhere. Swelling is noted at the end of the day after being on knee. Is resolved by morning. Denies any acute numbness or tingling in the area. Denies bruising.  Past Medical History:  Diagnosis Date  . Chickenpox   . 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 albuterol (PROVENTIL HFA;VENTOLIN HFA) 108 (90 BASE) MCG/ACT inhaler Inhale 2 puffs into the lungs every 6 (six) hours as needed for wheezing or shortness of breath. 1 Inhaler 0  . HYDROcodone-acetaminophen (NORCO) 7.5-325 MG per tablet Take 1 tablet by mouth every 6 (six) hours as needed for moderate pain.    Marland Kitchen ibuprofen (ADVIL,MOTRIN) 200 MG tablet Take 200 mg by mouth every 6 (six) hours as needed.    . pregabalin (LYRICA) 150 MG capsule Take 150 mg by mouth 3 (three) times daily.     No current facility-administered medications on file prior to visit.     Allergies  Allergen Reactions  . Morphine And Related Other (See Comments)    Family History  Problem Relation Age of Onset  . Diverticulitis Mother        Living  . Thyroid cancer Mother        dx in her 69s  . Obesity Father        Living  . Other Father   . Neurofibromatosis Father        multiple schwannoma  . Neurofibromatosis Brother        multiple schwannomas  . Neurofibromatosis Paternal Uncle   . Alzheimer's disease Maternal Grandmother   . Neurofibromatosis Paternal Grandfather   . Neurofibromatosis Paternal Aunt   . Neurofibromatosis Cousin    paternal first cousin/Paternal    Social History   Social History  . Marital status: Single    Spouse name: N/A  . Number of children: 0  . 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 Smoking status: Former Smoker    Packs/day: 1.00    Years: 3.00    Types: Cigarettes    Quit date: 09/30/2003  . Smokeless tobacco: Never Used  . Alcohol use No  . Drug use: Yes     Comment: rare marijuana  . Sexual activity: Yes    Partners: Female   Other Topics Concern  . None   Social History Narrative  . None   Review of Systems - See HPI.  All other ROS are negative.  BP 122/84   Pulse 96   Temp 98.3 F (36.8 C) (Oral)   Resp 14   Ht 5' 10.5" (1.791 m)   Wt 222 lb (100.7 kg)   SpO2 98%   BMI 31.40 kg/m   Physical Exam  Constitutional: He is oriented to person, place, and time and well-developed, well-nourished, and in no distress.  HENT:  Head: Normocephalic and atraumatic.  Eyes: Conjunctivae are normal.  Neck: Neck supple.  Cardiovascular: Normal rate,  regular rhythm, normal heart sounds and intact distal pulses.   Pulmonary/Chest: Effort normal and breath sounds normal. No respiratory distress. He has no wheezes. He has no rales. He exhibits no tenderness.  Musculoskeletal:       Left knee: He exhibits normal range of motion, no swelling, no erythema, normal alignment, no LCL laxity, normal patellar mobility, normal meniscus and no MCL laxity. Tenderness found. Medial joint line and patellar tendon tenderness noted.  Neurological: He is alert and oriented to person, place, and time.  Skin: Skin is warm and dry. No rash noted.  Psychiatric: Affect normal.  Vitals reviewed.  Assessment/Plan: 1. Injury of left knee, initial encounter Concern for meniscal injury despite exam findings. Will start with plain films. Knee brace to be worn daily. Continue pain medication as directed by specialist. Will proceed with MRI  if x-ray unremarkable.  - DG Knee Complete 4 Views Left; Future   Leeanne Rio, PA-C

## 2017-04-17 NOTE — Patient Instructions (Signed)
Please get a knee sleeve for stability and compression. Elevate leg while resting. Continue to use cane for ambulation -- Again I can send in a walker for you if needed until we get things figured out. Continue pain medications as directed.  We will alter regimen based on imaging results. We will likely need MRI to further assess.

## 2017-04-17 NOTE — Progress Notes (Signed)
Pre visit review using our clinic review tool, if applicable. No additional management support is needed unless otherwise documented below in the visit note. 

## 2017-04-19 ENCOUNTER — Other Ambulatory Visit: Payer: Self-pay | Admitting: Physician Assistant

## 2017-04-19 DIAGNOSIS — M25562 Pain in left knee: Secondary | ICD-10-CM

## 2017-05-05 ENCOUNTER — Ambulatory Visit: Payer: BLUE CROSS/BLUE SHIELD | Admitting: Family Medicine

## 2017-05-19 ENCOUNTER — Ambulatory Visit: Payer: Self-pay

## 2017-05-19 ENCOUNTER — Ambulatory Visit (INDEPENDENT_AMBULATORY_CARE_PROVIDER_SITE_OTHER): Payer: BLUE CROSS/BLUE SHIELD | Admitting: Family Medicine

## 2017-05-19 ENCOUNTER — Encounter: Payer: Self-pay | Admitting: Family Medicine

## 2017-05-19 VITALS — BP 122/74 | Ht 72.0 in | Wt 230.0 lb

## 2017-05-19 DIAGNOSIS — M25562 Pain in left knee: Secondary | ICD-10-CM | POA: Diagnosis not present

## 2017-05-19 MED ORDER — MELOXICAM 15 MG PO TABS: 15.0000 mg | ORAL_TABLET | Freq: Every day | ORAL | 0 refills | Status: DC

## 2017-05-19 NOTE — Patient Instructions (Signed)
   [place]

## 2017-05-19 NOTE — Progress Notes (Signed)
   Subjective:    Eric Hansen - 41 y.o. male MRN [id number]  Date of birth: 06/22/1976  CC: left knee pain    HPI  Eric Hansen is here for left knee pain. Pain located in the posterior aspect of the knee. Pain has been present for 2 months. Pain is a dull, continuous pain but is worsened with periods of standing or walking. No known injury or distinct "pop" of that knee. Endorses edema with prolonged periods of standing. Patient has a history of neurofibromatosis and has chronic right sided sciatica for which he uses a cane. Says his left leg has always been his stable, good leg. Prior to this episode of acute pain, he did have recurrent popping of the left knee for several years. Has not had any recent popping or giving out of that leg.    - Review of Systems: Per HPI.   reports that he quit smoking about 13 years ago. His smoking use included Cigarettes. He has a 3.00 pack-year smoking history. He has never used smokeless tobacco. - Past Medical History: Patient Active Problem List   Diagnosis Date Noted  . Genetic testing 06/11/2015  . 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 Schwannoma 05/25/2015  . Visit for preventive health examination 12/15/2014  . Encounter for immunization 12/15/2014  . Need for diphtheria-tetanus-pertussis (Tdap) vaccine, adult/adolescent 12/15/2014  . Neurofibromatosis ([place]) 12/15/2014      Objective:   Physical Exam Blood pressure 122/74, height 6' (1.829 m), weight 230 lb (104.3 kg). Gen: NAD, alert, cooperative with exam, well-appearing Knee: No edema of knees appreciated. ROM at knees is full bilaterally although full extension of left knee produces pain. Minimal left lateral joint line tenderness. TTP in left popliteal fossa; no mass appreciated. No pain with valgus or varus stressing. Ligaments of knee are intact and stable. Negative Thessaly's test of left. Left hamstring muscles weaker compared to right. Steady gait with chronic right sided  limp.   Ultrasound of left knee performed.  No suprapatellar edema. Quadriceps and patellar tendon intact. Medial meniscus normal. Lateral meniscus not as clear and with mild amount of edema. Popliteal fossa without edema or cyst.     Assessment & Plan:   1. Acute pain of left knee No edema in posterior aspect of knee or cysts/masses found in that area on ultrasound. May be related to over-use injury compensating for chronic right leg pain as hamstring muscles are weaker on the left.  - [place] LIMITED JOINT SPACE STRUCTURES LOW LEFT; Future - meloxicam (MOBIC) 15 MG tablet; Take 1 tablet (15 mg total) by mouth daily.  Dispense: 30 tablet; Refill: 0 -hamstring exercises given  -return in 2-3 weeks  -if no improvement in pain at f/u would likely need MRI; could also consider knee injection    Phill Myron, D.O. 05/19/2017, 9:13 AM PGY-3, [place]

## 2017-05-22 NOTE — Progress Notes (Signed)
[facility] Attending Note: I have seen and examined this patient. I have discussed this patient with the resident and reviewed the assessment and plan as documented above. I agree with the resident's findings and plan.  

## 2017-06-09 ENCOUNTER — Encounter: Payer: Self-pay | Admitting: Family Medicine

## 2017-06-09 ENCOUNTER — Ambulatory Visit (INDEPENDENT_AMBULATORY_CARE_PROVIDER_SITE_OTHER): Payer: BLUE CROSS/BLUE SHIELD | Admitting: Family Medicine

## 2017-06-09 VITALS — BP 130/80 | Ht 72.0 in | Wt 230.0 lb

## 2017-06-09 DIAGNOSIS — M1712 Unilateral primary osteoarthritis, left knee: Secondary | ICD-10-CM | POA: Diagnosis not present

## 2017-06-09 NOTE — Patient Instructions (Signed)
Call the office and make appt if your knee pain worsens or if you have any other concerns.

## 2017-06-09 NOTE — Progress Notes (Signed)
Chief complaint: Follow-up left knee pain 2.5 months  History of present illness: Eric Hansen is a 41 year old male who presents to the sports medicine office today for follow-up of left knee pain. He was last seen here in the office for initial evaluation back on 05/19/17. At that time, ultrasound was done, was essentially nondiagnostic. Lateral meniscus just showed mild amount of edema. He reports continuing to have some amount of pain in the posterior aspect of his left knee. He does not report of any any interval injury, trauma, or aggravating factors. He reports that the meloxicam has helped out with his symptoms, reports about 20% improvement in symptoms. He reports that he is not taking the medication on a daily basis. He reports occasional swelling and warmth, but mainly this is at the end the day. He reports occasional popping, catching, and buckling. He does not report of any actual giving way. No numbness, tingling, or weakness in his left lower extremity. Has been doing physical therapy exercises at home. Does have history of neurofibromatosis, has chronic right lower extremity weakness secondary to this. He ambulates with a cane in his right hand. He does report putting more weight on his left side secondary to weakness in his right leg.  Review of systems:  As stated above  Physical exam: Vital signs are reviewed and are documented in the chart Gen.: Alert, oriented, appears stated age, in no apparent distress HEENT: Moist oral mucosa Respiratory: Normal respirations, able to speak in full sentences Cardiac: Regular rate, distal pulses 2+ Integumentary: No rashes on visible skin:  Neurologic: Strength 5/5 with lower extremity, 4+/5 in right lower extremity, sensation 2+ bilateral lower extremities Psych: Normal affect, mood is described as good Musculoskeletal: Inspection of left knee reveals no obvious deformity or muscular atrophy, no warmth, erythema, ecchymosis, or effusion noted, he is  tender to palpation in the popliteal fossa on the posterior lateral side, no tenderness along the medial joint line or lateral joint line, no tenderness along quadriceps tendon or patellar tendon, Lachman, anterior drawer, valgus, varus stress testing negative, Murray negative for pain and crepitus, slight antalgic gait, walks with cane in his right hand  Assessment and plan: 1. Left knee pain, secondary to suspected primary osteoarthritis, with questionable degenerative left lateral meniscal tear  Left knee pain -Discussed with patient most likely his pain is from degenerative changes and osteoarthritis secondary to more weight and pressure being applied to his left knee -Discussed options today to continue with meloxicam or more aggressive therapy with cortisone injection and/or MRI of his left knee, after shared decision-making he would like to continue with meloxicam alone, as well as continue with his HEP -He reports not needing refill of meloxicam today  He will follow-up on as-needed basis if he has interval worsening of his left knee pain or any other concerns.     Mort Sawyers, M.D. [place]

## 2017-06-13 NOTE — Addendum Note (Signed)
Addended byDorcas Mcmurray L on: 06/13/2017 09:06 AM   Modules accepted: Level of Service

## 2017-06-13 NOTE — Progress Notes (Signed)
[facility]: Attending Note: I have reviewed the chart, discussed wit the Sports Medicine Fellow. I agree with assessment and treatment plan as detailed in the [place] note. I worry that long term he will need TKR as th\is knee is really doing job of two given his underlying gait abnormalities. He will continue w HEP.

## 2017-07-07 DIAGNOSIS — Q8500 Neurofibromatosis, unspecified: Secondary | ICD-10-CM | POA: Diagnosis not present

## 2017-07-07 DIAGNOSIS — M5416 Radiculopathy, lumbar region: Secondary | ICD-10-CM | POA: Diagnosis not present

## 2017-07-07 DIAGNOSIS — R030 Elevated blood-pressure reading, without diagnosis of hypertension: Secondary | ICD-10-CM | POA: Diagnosis not present

## 2017-07-07 DIAGNOSIS — M961 Postlaminectomy syndrome, not elsewhere classified: Secondary | ICD-10-CM | POA: Diagnosis not present

## 2017-07-10 ENCOUNTER — Ambulatory Visit (INDEPENDENT_AMBULATORY_CARE_PROVIDER_SITE_OTHER): Payer: BLUE CROSS/BLUE SHIELD | Admitting: Physician Assistant

## 2017-07-10 ENCOUNTER — Encounter: Payer: Self-pay | Admitting: Physician Assistant

## 2017-07-10 VITALS — BP 108/70 | HR 93 | Temp 98.4°F | Resp 14 | Ht 70.5 in | Wt 227.0 lb

## 2017-07-10 DIAGNOSIS — L237 Allergic contact dermatitis due to plants, except food: Secondary | ICD-10-CM | POA: Diagnosis not present

## 2017-07-10 DIAGNOSIS — L255 Unspecified contact dermatitis due to plants, except food: Secondary | ICD-10-CM

## 2017-07-10 MED ORDER — BETAMETHASONE DIPROPIONATE 0.05 % EX CREA: TOPICAL_CREAM | Freq: Two times a day (BID) | CUTANEOUS | 0 refills | Status: DC

## 2017-07-10 NOTE — Progress Notes (Signed)
Patient presents to clinic today c/o pruritic rash of R lower extremity over the past few days after exposure to poison oak. States he and his friends were in the woods. They are having similar symptoms. Denies fever, chest pain or SOB. Denies rash elsewhere. Has applied calamine lotion to help with itch.  Past Medical History:  Diagnosis Date  . Chickenpox   . 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 albuterol (PROVENTIL HFA;VENTOLIN HFA) 108 (90 BASE) MCG/ACT inhaler Inhale 2 puffs into the lungs every 6 (six) hours as needed for wheezing or shortness of breath. 1 Inhaler 0  . HYDROcodone-acetaminophen (NORCO) 7.5-325 MG per tablet Take 1 tablet by mouth every 6 (six) hours as needed for moderate pain.    Marland Kitchen ibuprofen (ADVIL,MOTRIN) 200 MG tablet Take 200 mg by mouth every 6 (six) hours as needed.    . nortriptyline (PAMELOR) 25 MG capsule TK 1 TO 2 CS PO QPM  2  . pregabalin (LYRICA) 150 MG capsule Take 150 mg by mouth 3 (three) times daily.     No current facility-administered medications on file prior to visit.     Allergies  Allergen Reactions  . Morphine And Related Other (See Comments)    Family History  Problem Relation Age of Onset  . Diverticulitis Mother        Living  . Thyroid cancer Mother        dx in her 27s  . Obesity Father        Living  . Other Father   . Neurofibromatosis Father        multiple schwannoma  . Neurofibromatosis Brother        multiple schwannomas  . Neurofibromatosis Paternal Uncle   . Alzheimer's disease Maternal Grandmother   . Neurofibromatosis Paternal Grandfather   . Neurofibromatosis Paternal Aunt   . Neurofibromatosis Cousin        paternal first cousin/Paternal    Social History   Social History  . Marital status: Single    Spouse name: N/A  . Number of children: 0  . 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 Smoking status: Former Smoker    Packs/day: 1.00    Years: 3.00    Types: Cigarettes    Quit date: 09/30/2003  . Smokeless tobacco: Never Used  . Alcohol use No  . Drug use: Yes     Comment: rare marijuana  . Sexual activity: Yes    Partners: Female   Other Topics Concern  . None   Social History Narrative  . None   Review of Systems - See HPI.  All other ROS are negative.  BP 108/70   Pulse 93   Temp 98.4 F (36.9 C) (Oral)   Resp 14   Ht 5' 10.5" (1.791 m)   Wt 227 lb (103 kg)   SpO2 97%   BMI 32.11 kg/m   Physical Exam  Constitutional: He is oriented to person, place, and time and well-developed, well-nourished, and in no distress.  HENT:  Head: Normocephalic and atraumatic.  Eyes: Conjunctivae are normal.  Neck: Neck supple.  Cardiovascular: Normal rate, regular rhythm, normal heart sounds and intact distal pulses.   Pulmonary/Chest: Effort normal and breath sounds normal. No respiratory distress. He has no wheezes. He has no rales. He exhibits no tenderness.  Neurological: He is alert and oriented to person, place, and time.  Skin: Skin is warm and dry.     Psychiatric: Affect normal.  Vitals reviewed.  Assessment/Plan: 1. Rhus dermatitis Start topical steroid BID for 10 day max. Supportive measures and OTC medications reviewed. Return precautions discussed.  - betamethasone dipropionate (DIPROLENE) 0.05 % cream; Apply topically 2 (two) times daily.  Dispense: 30 g; Refill: 0   Leeanne Rio, [place]

## 2017-07-10 NOTE — Patient Instructions (Signed)
Please keep skin clean and dry. Apply the steroid cream twice daily. Also consider some witch hazel to the area to help soothe. If you note any worsening symptoms, please call or come see me!

## 2017-07-10 NOTE — Progress Notes (Signed)
Pre visit review using our clinic review tool, if applicable. No additional management support is needed unless otherwise documented below in the visit note. 

## 2017-08-03 DIAGNOSIS — D225 Melanocytic nevi of trunk: Secondary | ICD-10-CM | POA: Diagnosis not present

## 2017-08-03 DIAGNOSIS — D2261 Melanocytic nevi of right upper limb, including shoulder: Secondary | ICD-10-CM | POA: Diagnosis not present

## 2017-08-03 DIAGNOSIS — L738 Other specified follicular disorders: Secondary | ICD-10-CM | POA: Diagnosis not present

## 2017-08-03 DIAGNOSIS — Z85828 Personal history of other malignant neoplasm of skin: Secondary | ICD-10-CM | POA: Diagnosis not present

## 2017-10-05 DIAGNOSIS — M961 Postlaminectomy syndrome, not elsewhere classified: Secondary | ICD-10-CM | POA: Diagnosis not present

## 2017-10-05 DIAGNOSIS — I10 Essential (primary) hypertension: Secondary | ICD-10-CM | POA: Diagnosis not present

## 2017-10-05 DIAGNOSIS — M5416 Radiculopathy, lumbar region: Secondary | ICD-10-CM | POA: Diagnosis not present

## 2017-10-05 DIAGNOSIS — Z6831 Body mass index (BMI) 31.0-31.9, adult: Secondary | ICD-10-CM | POA: Diagnosis not present

## 2017-12-18 DIAGNOSIS — Q8500 Neurofibromatosis, unspecified: Secondary | ICD-10-CM | POA: Diagnosis not present

## 2017-12-18 DIAGNOSIS — M5416 Radiculopathy, lumbar region: Secondary | ICD-10-CM | POA: Diagnosis not present

## 2017-12-18 DIAGNOSIS — I10 Essential (primary) hypertension: Secondary | ICD-10-CM | POA: Diagnosis not present

## 2017-12-18 DIAGNOSIS — M961 Postlaminectomy syndrome, not elsewhere classified: Secondary | ICD-10-CM | POA: Diagnosis not present

## 2018-03-19 DIAGNOSIS — M5416 Radiculopathy, lumbar region: Secondary | ICD-10-CM | POA: Diagnosis not present

## 2018-03-19 DIAGNOSIS — Q8500 Neurofibromatosis, unspecified: Secondary | ICD-10-CM | POA: Diagnosis not present

## 2018-03-19 DIAGNOSIS — M961 Postlaminectomy syndrome, not elsewhere classified: Secondary | ICD-10-CM | POA: Diagnosis not present

## 2018-05-09 ENCOUNTER — Encounter: Payer: Self-pay | Admitting: Family Medicine

## 2018-05-09 ENCOUNTER — Ambulatory Visit: Payer: BLUE CROSS/BLUE SHIELD | Admitting: Family Medicine

## 2018-05-09 ENCOUNTER — Other Ambulatory Visit: Payer: Self-pay

## 2018-05-09 VITALS — BP 122/80 | HR 105 | Temp 98.9°F | Ht 70.5 in | Wt 233.8 lb

## 2018-05-09 DIAGNOSIS — J208 Acute bronchitis due to other specified organisms: Secondary | ICD-10-CM | POA: Diagnosis not present

## 2018-05-09 MED ORDER — GUAIFENESIN-CODEINE 100-10 MG/5ML PO SOLN: 5.0000 mL | Freq: Four times a day (QID) | ORAL | 0 refills | Status: DC | PRN

## 2018-05-09 NOTE — Patient Instructions (Signed)
Please follow up if symptoms do not improve or as needed.   Use Mucinex DM twice a day.  Use the cough syrup as needed at nighttime.   Acute Bronchitis, Adult Acute bronchitis is sudden (acute) swelling of the air tubes (bronchi) in the lungs. Acute bronchitis causes these tubes to fill with mucus, which can make it hard to breathe. It can also cause coughing or wheezing. In adults, acute bronchitis usually goes away within 2 weeks. A cough caused by bronchitis may last up to 3 weeks. Smoking, allergies, and asthma can make the condition worse. Repeated episodes of bronchitis may cause further lung problems, such as chronic obstructive pulmonary disease (COPD). What are the causes? This condition can be caused by germs and by substances that irritate the lungs, including:  Cold and flu viruses. This condition is most often caused by the same virus that causes a cold.  Bacteria.  Exposure to tobacco smoke, dust, fumes, and air pollution.  What increases the risk? This condition is more likely to develop in people who:  Have close contact with someone with acute bronchitis.  Are exposed to lung irritants, such as tobacco smoke, dust, fumes, and vapors.  Have a weak immune system.  Have a respiratory condition such as asthma.  What are the signs or symptoms? Symptoms of this condition include:  A cough.  Coughing up clear, yellow, or green mucus.  Wheezing.  Chest congestion.  Shortness of breath.  A fever.  Body aches.  Chills.  A sore throat.  How is this diagnosed? This condition is usually diagnosed with a physical exam. During the exam, your health care provider may order tests, such as chest X-rays, to rule out other conditions. He or she may also:  Test a sample of your mucus for bacterial infection.  Check the level of oxygen in your blood. This is done to check for pneumonia.  Do a chest X-ray or lung function testing to rule out pneumonia and other  conditions.  Perform blood tests.  Your health care provider will also ask about your symptoms and medical history. How is this treated? Most cases of acute bronchitis clear up over time without treatment. Your health care provider may recommend:  Drinking more fluids. Drinking more makes your mucus thinner, which may make it easier to breathe.  Taking a medicine for a fever or cough.  Taking an antibiotic medicine.  Using an inhaler to help improve shortness of breath and to control a cough.  Using a cool mist vaporizer or humidifier to make it easier to breathe.  Follow these instructions at home: Medicines  Take over-the-counter and prescription medicines only as told by your health care provider.  If you were prescribed an antibiotic, take it as told by your health care provider. Do not stop taking the antibiotic even if you start to feel better. General instructions  Get plenty of rest.  Drink enough fluids to keep your urine clear or pale yellow.  Avoid smoking and secondhand smoke. Exposure to cigarette smoke or irritating chemicals will make bronchitis worse. If you smoke and you need help quitting, ask your health care provider. Quitting smoking will help your lungs heal faster.  Use an inhaler, cool mist vaporizer, or humidifier as told by your health care provider.  Keep all follow-up visits as told by your health care provider. This is important. How is this prevented? To lower your risk of getting this condition again:  Wash your hands often with soap  and water. If soap and water are not available, use hand sanitizer.  Avoid contact with people who have cold symptoms.  Try not to touch your hands to your mouth, nose, or eyes.  Make sure to get the flu shot every year.  Contact a health care provider if:  Your symptoms do not improve in 2 weeks of treatment. Get help right away if:  You cough up blood.  You have chest pain.  You have severe shortness  of breath.  You become dehydrated.  You faint or keep feeling like you are going to faint.  You keep vomiting.  You have a severe headache.  Your fever or chills gets worse. This information is not intended to replace advice given to you by your health care provider. Make sure you discuss any questions you have with your health care provider. Document Released: 11/03/2004 Document Revised: 04/20/2016 Document Reviewed: 03/16/2016 Elsevier Interactive Patient Education  [employer].

## 2018-05-09 NOTE — Progress Notes (Signed)
Subjective  CC:  Chief Complaint  Patient presents with  . Sinus Problem    stratchy throat, cough x 2 days     HPI: SUBJECTIVE:  Eric Hansen is a 42 y.o. male who complains of congestion, nasal blockage, post nasal drip, cough described as harsh and productive and denies sinus, high fevers, SOB, chest pain or significant GI symptoms. Symptoms have been present for 2-3 days. Was in [place] for comic book convention over weekend so possibly exposed to respiratory illnesses. He denies a history of anorexia, dizziness, vomiting and wheezing. He denies a history of asthma or COPD. Patient does not smoke cigarettes.  Assessment  1. Viral bronchitis      Plan  Discussion:  Likely viral. Education regarding differences between viral and bacterial infections and treatment options are discussed.  Supportive care measures are recommended.  We discussed the use of mucolytic's, decongestants, antihistamines and antitussives as needed.  Tylenol or Advil are recommended if needed.  Follow up: Return if symptoms worsen or fail to improve.   No orders of the defined types were placed in this encounter.  Meds ordered this encounter  Medications  . guaiFENesin-codeine 100-10 MG/5ML syrup    Sig: Take 5 mLs by mouth every 6 (six) hours as needed for cough.    Dispense:  120 mL    Refill:  0      I reviewed the patients updated PMH, FH, and SocHx.  Social History: Patient  reports that he quit smoking about 14 years ago. His smoking use included cigarettes. He has a 3.00 pack-year smoking history. He has never used smokeless tobacco. He reports that he has current or past drug history. He reports that he does not drink alcohol.  Patient Active Problem List   Diagnosis Date Noted  . Genetic testing 06/11/2015  . 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 Schwannoma 05/25/2015  . Visit for preventive health examination 12/15/2014  . Encounter for immunization 12/15/2014  . Need for  diphtheria-tetanus-pertussis (Tdap) vaccine, adult/adolescent 12/15/2014  . 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 HYDROcodone-acetaminophen (NORCO) 7.5-325 MG per tablet Take 1 tablet by mouth every 6 (six) hours as needed for moderate pain.  . pregabalin (LYRICA) 150 MG capsule Take 150 mg by mouth 3 (three) times daily.    Objective  Vitals: BP 122/80   Pulse (!) 105   Temp 98.9 F (37.2 C)   Ht 5' 10.5" (1.791 m)   Wt 233 lb 12.8 oz (106.1 kg)   SpO2 95%   BMI 33.07 kg/m  General: no acute distress  Psych:  Alert and oriented, normal mood and affect HEENT:  Normocephalic, atraumatic, supple neck, moist mucous membranes, mildly erythematous pharynx without exudate, mild lymphadenopathy, supple neck Cardiovascular:  RRR without murmur. no edema Respiratory:  Good breath sounds bilaterally, CTAB with normal respiratory effort  Skin:  Warm, no rashes Neurologic:   Mental status is normal. normal gait  Commons side effects, risks, benefits, and alternatives for medications and treatment plan prescribed today were discussed, and the patient expressed understanding of the given instructions. Patient is instructed to call or message via MyChart if he/she has any questions or concerns regarding our treatment plan. No barriers to understanding were identified. We discussed Red Flag symptoms and signs in detail. Patient expressed understanding regarding  what to do in case of urgent or emergency type symptoms.  Medication list was reconciled, printed and provided to the patient in AVS. Patient instructions and summary information was reviewed with the patient as documented in the AVS. This note was prepared with assistance of Dragon voice recognition  software. Occasional wrong-word or sound-a-like substitutions may have occurred due to the inherent limitations of voice recognition software

## 2018-06-01 ENCOUNTER — Encounter: Payer: Self-pay | Admitting: Physician Assistant

## 2018-06-01 ENCOUNTER — Ambulatory Visit (HOSPITAL_COMMUNITY)
Admission: RE | Admit: 2018-06-01 | Discharge: 2018-06-01 | Disposition: A | Discharge status: Home / Self Care | Payer: BLUE CROSS/BLUE SHIELD | Source: Ambulatory Visit | Attending: Physician Assistant | Admitting: Physician Assistant

## 2018-06-01 ENCOUNTER — Other Ambulatory Visit: Payer: Self-pay | Admitting: Physician Assistant

## 2018-06-01 ENCOUNTER — Other Ambulatory Visit: Payer: Self-pay

## 2018-06-01 ENCOUNTER — Telehealth: Payer: Self-pay | Admitting: Physician Assistant

## 2018-06-01 ENCOUNTER — Ambulatory Visit: Payer: BLUE CROSS/BLUE SHIELD | Admitting: Physician Assistant

## 2018-06-01 VITALS — BP 110/70 | HR 89 | Temp 97.8°F | Resp 16 | Ht 70.5 in | Wt 233.0 lb

## 2018-06-01 DIAGNOSIS — D3610 Benign neoplasm of peripheral nerves and autonomic nervous system, unspecified: Secondary | ICD-10-CM | POA: Insufficient documentation

## 2018-06-01 DIAGNOSIS — R109 Unspecified abdominal pain: Secondary | ICD-10-CM

## 2018-06-01 DIAGNOSIS — R3915 Urgency of urination: Secondary | ICD-10-CM | POA: Diagnosis not present

## 2018-06-01 LAB — POCT URINALYSIS DIPSTICK
BILIRUBIN UA: NEGATIVE
Blood, UA: NEGATIVE
Glucose, UA: NEGATIVE
KETONES UA: NEGATIVE
Leukocytes, UA: NEGATIVE
Nitrite, UA: NEGATIVE
PH UA: 5 (ref 5.0–8.0)
Protein, UA: NEGATIVE
Spec Grav, UA: 1.015 (ref 1.010–1.025)
UROBILINOGEN UA: 0.2 U/dL

## 2018-06-01 MED ORDER — MELOXICAM 15 MG PO TABS: 15.0000 mg | ORAL_TABLET | Freq: Every day | ORAL | 0 refills | Status: DC

## 2018-06-01 NOTE — Telephone Encounter (Signed)
Copied from [place] [id number]. Topic: Quick Communication - See Telephone Encounter >> Jun 01, 2018  9:44 AM Blase Mess A wrote: CRM for notification. See Telephone encounter for: 06/01/18.Brandy called from [place] service calling for Prior authorization for the CT scan that Mr. Vicie Mutters had scheduled as an add on today (8/23) at 9:00a.  [employer] Cb# [id number]

## 2018-06-01 NOTE — Patient Instructions (Signed)
Please stop by the front desk to speak with [facility] regarding your CT scan. I am highly concerned for a stone.  Keep well-hydrated.  Continue pain medication as directed. Can use Ibuprofen for breakthrough pain as well.  Once we figure out if there is a stone, its size and location we will be able to change therapy accordingly.  Flank Pain Flank pain is pain in your side. The flank is the area of your side between your upper belly (abdomen) and your back. The pain may occur over a short period of time (acute) or may be long-term or come back often (chronic). It may be mild or very bad. Pain in this area can be caused by many different things. Follow these instructions at home:  Rest as told by your doctor.  Drink enough fluid to keep your pee (urine) clear or pale yellow.  Take over-the-counter and prescription medicines only as told by your doctor.  Keep all follow-up visits as told by your doctor. This is important. Contact a doctor if:  Medicine does not help your pain.  You have new symptoms.  Your pain gets worse.  You have a fever.  Your symptoms last longer than 2-3 days. Get help right away if:  Your tummy hurts or is swollen.  You are short of breath.  You feel sick to your stomach (nauseous) and it does not go away.  You cannot stop throwing up (vomiting).  You feel like you will pass out or you do pass out (faint).  You have blood in your pee.  You have a fever and your symptoms suddenly get worse. This information is not intended to replace advice given to you by your health care provider. Make sure you discuss any questions you have with your health care provider. Document Released: 07/05/2008 Document Revised: 06/17/2016 Document Reviewed: 06/30/2015 Elsevier Interactive Patient Education  2018 [employer].

## 2018-06-01 NOTE — Progress Notes (Signed)
Patient presents to clinic today c/o episodic L flank pain starting Tuesday morning. Notes significant pain lasting for 15 minutes or so before easing off. Has had a few episodes of this. Notes some associated urinary urgency. Denies dysuria, hematuria, nausea/vomiting, flank pain. Denies fever chills. Denies history of nephrolithiasis. Has hx of neurofibromatosis for which he has a chronic Rx Norco. Has taken for this pain as well.    Past Medical History:  Diagnosis Date  . Chickenpox   . Neurofibromatosis [facility])     Current Outpatient Medications on File Prior to Visit  Medication Sig Dispense Refill  . albuterol (PROVENTIL HFA;VENTOLIN HFA) 108 (90 BASE) MCG/ACT inhaler Inhale 2 puffs into the lungs every 6 (six) hours as needed for wheezing or shortness of breath. 1 Inhaler 0  . HYDROcodone-acetaminophen (NORCO) 7.5-325 MG per tablet Take 1 tablet by mouth every 6 (six) hours as needed for moderate pain.    . pregabalin (LYRICA) 150 MG capsule Take 150 mg by mouth 3 (three) times daily.     No current facility-administered medications on file prior to visit.     Allergies  Allergen Reactions  . Morphine And Related Other (See Comments)    Family History  Problem Relation Age of Onset  . Diverticulitis Mother        Living  . Thyroid cancer Mother        dx in her 62s  . Obesity Father        Living  . Other Father   . Neurofibromatosis Father        multiple schwannoma  . Neurofibromatosis Brother        multiple schwannomas  . Neurofibromatosis Paternal Uncle   . Alzheimer's disease Maternal Grandmother   . Neurofibromatosis Paternal Grandfather   . Neurofibromatosis Paternal Aunt   . Neurofibromatosis Cousin        paternal first cousin/Paternal    Social History   Socioeconomic History  . Marital status: Single    Spouse name: Not on file  . Number of children: 0  . Years of education: Not on file  . Highest education level: Not on file  Occupational  History  . Occupation: Photographer  Social Needs  . Financial resource strain: Not on file  . Food insecurity:    Worry: Not on file    Inability: Not on file  . Transportation needs:    Medical: Not on file    Non-medical: Not on file  Tobacco Use  . Smoking status: Former Smoker    Packs/day: 1.00    Years: 3.00    Pack years: 3.00    Types: Cigarettes    Last attempt to quit: 09/30/2003    Years since quitting: 14.6  . Smokeless tobacco: Never Used  Substance and Sexual Activity  . Alcohol use: No    Alcohol/week: 0.0 standard drinks  . Drug use: Yes    Comment: rare marijuana  . Sexual activity: Yes    Partners: Female  Lifestyle  . Physical activity:    Days per week: Not on file    Minutes per session: Not on file  . Stress: Not on file  Relationships  . Social connections:    Talks on phone: Not on file    Gets together: Not on file    Attends religious service: Not on file    Active member of club or organization: Not on file    Attends meetings of clubs or organizations: Not  on file    Relationship status: Not on file  Other Topics Concern  . Not on file  Social History Narrative  . Not on file   Review of Systems - See HPI.  All other ROS are negative.  BP 110/70   Pulse 89   Temp 97.8 F (36.6 C) (Oral)   Resp 16   Ht 5' 10.5" (1.791 m)   Wt 233 lb (105.7 kg)   SpO2 98%   BMI 32.96 kg/m   Physical Exam  Constitutional: He is oriented to person, place, and time.  Patient well-developed, uncomfortable but without acute distress  HENT:  Head: Normocephalic and atraumatic.  Cardiovascular: Normal rate, regular rhythm, normal heart sounds and intact distal pulses.  Pulmonary/Chest: Effort normal and breath sounds normal. No stridor. No respiratory distress. He has no wheezes. He has no rales. He exhibits no tenderness.  Abdominal: Soft. Bowel sounds are normal. He exhibits no distension and no mass. There is no tenderness. There is no rebound  and no guarding.  Negative CVA tenderness  Neurological: He is alert and oriented to person, place, and time.  Psychiatric: He has a normal mood and affect.  Vitals reviewed.  Assessment/Plan: 1. Left flank pain Urine dip negative. Concern for stone giving episodic pain and urinary urgency. Will check CT renal study to assess for stone. Pain control reviewed. Strict ER preautions given. If stone small, will start MET.  - CT RENAL STONE STUDY; Future - POCT urinalysis dipstick   Leeanne Rio, PA-C

## 2018-06-18 DIAGNOSIS — I10 Essential (primary) hypertension: Secondary | ICD-10-CM | POA: Diagnosis not present

## 2018-06-18 DIAGNOSIS — M5416 Radiculopathy, lumbar region: Secondary | ICD-10-CM | POA: Diagnosis not present

## 2018-06-18 DIAGNOSIS — Q8500 Neurofibromatosis, unspecified: Secondary | ICD-10-CM | POA: Diagnosis not present

## 2018-06-18 DIAGNOSIS — Z6830 Body mass index (BMI) 30.0-30.9, adult: Secondary | ICD-10-CM | POA: Diagnosis not present

## 2018-06-27 DIAGNOSIS — M48061 Spinal stenosis, lumbar region without neurogenic claudication: Secondary | ICD-10-CM | POA: Diagnosis not present

## 2018-06-27 DIAGNOSIS — M5126 Other intervertebral disc displacement, lumbar region: Secondary | ICD-10-CM | POA: Diagnosis not present

## 2018-06-28 DIAGNOSIS — Q8500 Neurofibromatosis, unspecified: Secondary | ICD-10-CM | POA: Diagnosis not present

## 2018-07-25 DIAGNOSIS — D2261 Melanocytic nevi of right upper limb, including shoulder: Secondary | ICD-10-CM | POA: Diagnosis not present

## 2018-07-25 DIAGNOSIS — D485 Neoplasm of uncertain behavior of skin: Secondary | ICD-10-CM | POA: Diagnosis not present

## 2018-07-25 DIAGNOSIS — Z85828 Personal history of other malignant neoplasm of skin: Secondary | ICD-10-CM | POA: Diagnosis not present

## 2018-07-25 DIAGNOSIS — D225 Melanocytic nevi of trunk: Secondary | ICD-10-CM | POA: Diagnosis not present

## 2018-07-25 DIAGNOSIS — D2262 Melanocytic nevi of left upper limb, including shoulder: Secondary | ICD-10-CM | POA: Diagnosis not present

## 2018-09-05 DIAGNOSIS — Q8500 Neurofibromatosis, unspecified: Secondary | ICD-10-CM | POA: Diagnosis not present

## 2018-09-05 DIAGNOSIS — M961 Postlaminectomy syndrome, not elsewhere classified: Secondary | ICD-10-CM | POA: Diagnosis not present

## 2018-09-05 DIAGNOSIS — R030 Elevated blood-pressure reading, without diagnosis of hypertension: Secondary | ICD-10-CM | POA: Diagnosis not present

## 2018-09-05 DIAGNOSIS — Z6831 Body mass index (BMI) 31.0-31.9, adult: Secondary | ICD-10-CM | POA: Diagnosis not present

## 2018-09-12 ENCOUNTER — Other Ambulatory Visit: Payer: Self-pay | Admitting: Radiation Therapy

## 2018-09-12 DIAGNOSIS — R030 Elevated blood-pressure reading, without diagnosis of hypertension: Secondary | ICD-10-CM | POA: Diagnosis not present

## 2018-09-12 DIAGNOSIS — Z6832 Body mass index (BMI) 32.0-32.9, adult: Secondary | ICD-10-CM | POA: Diagnosis not present

## 2018-09-12 DIAGNOSIS — M5416 Radiculopathy, lumbar region: Secondary | ICD-10-CM | POA: Diagnosis not present

## 2018-09-17 ENCOUNTER — Other Ambulatory Visit: Payer: BLUE CROSS/BLUE SHIELD

## 2018-09-20 DIAGNOSIS — M5416 Radiculopathy, lumbar region: Secondary | ICD-10-CM | POA: Diagnosis not present

## 2018-09-20 NOTE — Progress Notes (Signed)
Brain and Spine Tumor Board Documentation  Eric Hansen was presented by Cecil Cobbs, MD at Brain and Spine Tumor Board on 09/20/2018, which included representatives from neuro oncology, radiation oncology, surgical oncology, navigation, pathology, radiology, genetics.  Eric Hansen was presented as a new patient with history of the following treatments:  .  Additionally, we reviewed previous medical and familial history, history of present illness, and recent lab results along with all available histopathologic and imaging studies. The tumor board considered available treatment options and made the following recommendations:    genetics referral  Tumor board is a meeting of clinicians from various specialty areas who evaluate and discuss patients for whom a multidisciplinary approach is being considered. Final determinations in the plan of care are those of the provider(s). The responsibility for follow up of recommendations given during tumor board is that of the provider.   Today's extended care, comprehensive team conference, Eric Hansen was not present for the discussion and was not examined.

## 2018-11-01 ENCOUNTER — Encounter: Payer: Self-pay | Admitting: Physician Assistant

## 2018-11-01 ENCOUNTER — Other Ambulatory Visit: Payer: Self-pay

## 2018-11-01 ENCOUNTER — Ambulatory Visit: Payer: BLUE CROSS/BLUE SHIELD | Admitting: Physician Assistant

## 2018-11-01 VITALS — BP 112/78 | HR 88 | Temp 98.1°F | Resp 16 | Ht 70.5 in | Wt 246.0 lb

## 2018-11-01 DIAGNOSIS — B9789 Other viral agents as the cause of diseases classified elsewhere: Secondary | ICD-10-CM | POA: Diagnosis not present

## 2018-11-01 DIAGNOSIS — J069 Acute upper respiratory infection, unspecified: Secondary | ICD-10-CM | POA: Diagnosis not present

## 2018-11-01 MED ORDER — BENZONATATE 100 MG PO CAPS: 100.0000 mg | ORAL_CAPSULE | Freq: Three times a day (TID) | ORAL | 0 refills | Status: DC | PRN

## 2018-11-01 MED ORDER — ALBUTEROL SULFATE HFA 108 (90 BASE) MCG/ACT IN AERS: 2.0000 | INHALATION_SPRAY | Freq: Four times a day (QID) | RESPIRATORY_TRACT | 0 refills | Status: DC | PRN

## 2018-11-01 NOTE — Progress Notes (Signed)
Patient presents to clinic today c/o 4.5 days of nasal congestion, chest congestion with cough that is sometimes productive and fatigue. Denies fever, chills, nausea/vomiting, chest pain, LH. Notes some SOB with exertion and mild wheezing. Has not had to use rescue inhaler. Feels symptoms have improved since onset.   Past Medical History:  Diagnosis Date  . Chickenpox   . Neurofibromatosis [facility])     Current Outpatient Medications on File Prior to Visit  Medication Sig Dispense Refill  . oxyCODONE-acetaminophen (PERCOCET) 7.5-325 MG tablet Take 1 tablet by mouth 3 (three) times daily.    . pregabalin (LYRICA) 150 MG capsule Take 150 mg by mouth 3 (three) times daily.     No current facility-administered medications on file prior to visit.     Allergies  Allergen Reactions  . Morphine And Related Other (See Comments)    Family History  Problem Relation Age of Onset  . Diverticulitis Mother        Living  . Thyroid cancer Mother        dx in her 87s  . Obesity Father        Living  . Other Father   . Neurofibromatosis Father        multiple schwannoma  . Neurofibromatosis Brother        multiple schwannomas  . Neurofibromatosis Paternal Uncle   . Alzheimer's disease Maternal Grandmother   . Neurofibromatosis Paternal Grandfather   . Neurofibromatosis Paternal Aunt   . Neurofibromatosis Cousin        paternal first cousin/Paternal    Social History   Socioeconomic History  . Marital status: Single    Spouse name: Not on file  . Number of children: 0  . Years of education: Not on file  . Highest education level: Not on file  Occupational History  . Occupation: Photographer  Social Needs  . Financial resource strain: Not on file  . Food insecurity:    Worry: Not on file    Inability: Not on file  . Transportation needs:    Medical: Not on file    Non-medical: Not on file  Tobacco Use  . Smoking status: Former Smoker    Packs/day: 1.00    Years: 3.00    Pack years: 3.00    Types: Cigarettes    Last attempt to quit: 09/30/2003    Years since quitting: 15.0  . Smokeless tobacco: Never Used  Substance and Sexual Activity  . Alcohol use: No    Alcohol/week: 0.0 standard drinks  . Drug use: Yes    Comment: rare marijuana  . Sexual activity: Yes    Partners: Female  Lifestyle  . Physical activity:    Days per week: Not on file    Minutes per session: Not on file  . Stress: Not on file  Relationships  . Social connections:    Talks on phone: Not on file    Gets together: Not on file    Attends religious service: Not on file    Active member of club or organization: Not on file    Attends meetings of clubs or organizations: Not on file    Relationship status: Not on file  Other Topics Concern  . Not on file  Social History Narrative  . Not on file   Review of Systems - See HPI.  All other ROS are negative.  BP 112/78   Pulse 88   Temp 98.1 F (36.7 C) (Oral)   Resp  16   Ht 5' 10.5" (1.791 m)   Wt 246 lb (111.6 kg)   SpO2 98%   BMI 34.80 kg/m   Physical Exam Vitals signs reviewed.  Constitutional:      Appearance: Normal appearance.  HENT:     Head: Normocephalic and atraumatic.     Right Ear: Tympanic membrane and ear canal normal.     Left Ear: Tympanic membrane and ear canal normal.     Nose: Congestion present.     Mouth/Throat:     Mouth: Mucous membranes are moist.  Eyes:     Conjunctiva/sclera: Conjunctivae normal.  Neck:     Musculoskeletal: Neck supple.  Cardiovascular:     Rate and Rhythm: Normal rate and regular rhythm.     Pulses: Normal pulses.     Heart sounds: Normal heart sounds.  Pulmonary:     Effort: Pulmonary effort is normal.     Breath sounds: Normal breath sounds.  Neurological:     General: No focal deficit present.     Mental Status: He is alert and oriented to person, place, and time.  Psychiatric:        Mood and Affect: Mood normal.    Assessment/Plan: 1. Viral URI with  cough Lungs CTAB. Examination unremarkable overall. Supportive measures and OTC medications reviewed. Rx Tessalon and Albuterol. Return precautions reviewed.   - benzonatate (TESSALON) 100 MG capsule; Take 1 capsule (100 mg total) by mouth 3 (three) times daily as needed for cough.  Dispense: 30 capsule; Refill: 0 - albuterol (PROVENTIL HFA;VENTOLIN HFA) 108 (90 Base) MCG/ACT inhaler; Inhale 2 puffs into the lungs every 6 (six) hours as needed for wheezing or shortness of breath.  Dispense: 1 Inhaler; Refill: 0   Leeanne Rio, PA-C

## 2018-11-01 NOTE — Patient Instructions (Signed)
Please keep well-hydrated and get plenty of rest. Try over-the-counter Theraflu or Tylenol Cold/Sinus to help with symptoms. I have sent in a prescription cough medication, Tessalon, to take as directed.  Put a humidifier in the bedroom.   Use your albuterol inhaler as directed if needed. Call me if any symptoms are deteriorating.    Viral Illness, Adult Viruses are tiny germs that can get into a person's body and cause illness. There are many different types of viruses, and they cause many types of illness. Viral illnesses can range from mild to severe. They can affect various parts of the body. Common illnesses that are caused by a virus include colds and the flu. Viral illnesses also include serious conditions such as HIV/AIDS (human immunodeficiency virus/acquired immunodeficiency syndrome). A few viruses have been linked to certain cancers. What are the causes? Many types of viruses can cause illness. Viruses invade cells in your body, multiply, and cause the infected cells to malfunction or die. When the cell dies, it releases more of the virus. When this happens, you develop symptoms of the illness, and the virus continues to spread to other cells. If the virus takes over the function of the cell, it can cause the cell to divide and grow out of control, as is the case when a virus causes cancer. Different viruses get into the body in different ways. You can get a virus by:  Swallowing food or water that is contaminated with the virus.  Breathing in droplets that have been coughed or sneezed into the air by an infected person.  Touching a surface that has been contaminated with the virus and then touching your eyes, nose, or mouth.  Being bitten by an insect or animal that carries the virus.  Having sexual contact with a person who is infected with the virus.  Being exposed to blood or fluids that contain the virus, either through an open cut or during a transfusion. If a virus  enters your body, your body's defense system (immune system) will try to fight the virus. You may be at higher risk for a viral illness if your immune system is weak. What are the signs or symptoms? Symptoms vary depending on the type of virus and the location of the cells that it invades. Common symptoms of the main types of viral illnesses include: Cold and flu viruses  Fever.  Headache.  Sore throat.  Muscle aches.  Nasal congestion.  Cough. Digestive system (gastrointestinal) viruses  Fever.  Abdominal pain.  Nausea.  Diarrhea. Liver viruses (hepatitis)  Loss of appetite.  Tiredness.  Yellowing of the skin (jaundice). Brain and spinal cord viruses  Fever.  Headache.  Stiff neck.  Nausea and vomiting.  Confusion or sleepiness. Skin viruses  Warts.  Itching.  Rash. Sexually transmitted viruses  Discharge.  Swelling.  Redness.  Rash. How is this treated? Viruses can be difficult to treat because they live within cells. Antibiotic medicines do not treat viruses because these drugs do not get inside cells. Treatment for a viral illness may include:  Resting and drinking plenty of fluids.  Medicines to relieve symptoms. These can include over-the-counter medicine for pain and fever, medicines for cough or congestion, and medicines to relieve diarrhea.  Antiviral medicines. These drugs are available only for certain types of viruses. They may help reduce flu symptoms if taken early. There are also many antiviral medicines for hepatitis and HIV/AIDS. Some viral illnesses can be prevented with vaccinations. A common example is the  flu shot. Follow these instructions at home: Medicines   Take over-the-counter and prescription medicines only as told by your health care provider.  If you were prescribed an antiviral medicine, take it as told by your health care provider. Do not stop taking the medicine even if you start to feel better.  Be aware of  when antibiotics are needed and when they are not needed. Antibiotics do not treat viruses. If your health care provider thinks that you may have a bacterial infection as well as a viral infection, you may get an antibiotic. ? Do not ask for an antibiotic prescription if you have been diagnosed with a viral illness. That will not make your illness go away faster. ? Frequently taking antibiotics when they are not needed can lead to antibiotic resistance. When this develops, the medicine no longer works against the bacteria that it normally fights. General instructions  Drink enough fluids to keep your urine clear or pale yellow.  Rest as much as possible.  Return to your normal activities as told by your health care provider. Ask your health care provider what activities are safe for you.  Keep all follow-up visits as told by your health care provider. This is important. How is this prevented? Take these actions to reduce your risk of viral infection:  Eat a healthy diet and get enough rest.  Wash your hands often with soap and water. This is especially important when you are in public places. If soap and water are not available, use hand sanitizer.  Avoid close contact with friends and family who have a viral illness.  If you travel to areas where viral gastrointestinal infection is common, avoid drinking water or eating raw food.  Keep your immunizations up to date. Get a flu shot every year as told by your health care provider.  Do not share toothbrushes, nail clippers, razors, or needles with other people.  Always practice safe sex.  Contact a health care provider if:  You have symptoms of a viral illness that do not go away.  Your symptoms come back after going away.  Your symptoms get worse. Get help right away if:  You have trouble breathing.  You have a severe headache or a stiff neck.  You have severe vomiting or abdominal pain. This information is not intended to  replace advice given to you by your health care provider. Make sure you discuss any questions you have with your health care provider. Document Released: 02/05/2016 Document Revised: 03/09/2016 Document Reviewed: 02/05/2016 Elsevier Interactive Patient Education  2019 [employer].

## 2018-11-01 NOTE — Progress Notes (Deleted)
Acute Office Visit  Subjective:    Patient ID: Eric Hansen, male    DOB: 04-19-1976, 43 y.o.   MRN: [id number]  Chief Complaint  Patient presents with  . URI    HPI Patient is in today for *** Saturday (1-18) began with productive cough with yellow phlegm, sore throat, fatigue, runny nose, headache, body aches, patient endorses feeling "run down" Patient does state that he is slowly improving since Saturday.   Patient does endorse some SOB with exertion,  Pt denies palpitations, chest tightness, nausea, vomiting, diarrhea, constipation, ear involvement,   Past Medical History:  Diagnosis Date  . Chickenpox   . 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 biopsy abdomen  2006  . schwannomas    . spinal tumor  2009   Benign  . WISDOM TOOTH EXTRACTION      Family History  Problem Relation Age of Onset  . Diverticulitis Mother        Living  . Thyroid cancer Mother        dx in her 65s  . Obesity Father        Living  . Other Father   . Neurofibromatosis Father        multiple schwannoma  . Neurofibromatosis Brother        multiple schwannomas  . Neurofibromatosis Paternal Uncle   . Alzheimer's disease Maternal Grandmother   . Neurofibromatosis Paternal Grandfather   . Neurofibromatosis Paternal Aunt   . Neurofibromatosis Cousin        paternal first cousin/Paternal    Social History   Socioeconomic History  . Marital status: Single    Spouse name: Not on file  . Number of children: 0  . Years of education: Not on file  . Highest education level: Not on file  Occupational History  . Occupation: Immunologist  Social Needs  . Financial resource strain: Not on file  . Food insecurity:    Worry: Not on file    Inability: Not on file  . Transportation needs:    Medical: Not on file    Non-medical: Not on file  Tobacco Use  . Smoking status: Former Smoker    Packs/day: 1.00    Years: 3.00    Pack years: 3.00      Types: Cigarettes    Last attempt to quit: 09/30/2003    Years since quitting: 15.0  . Smokeless tobacco: Never Used  Substance and Sexual Activity  . Alcohol use: No    Alcohol/week: 0.0 standard drinks  . Drug use: Yes    Comment: rare marijuana  . Sexual activity: Yes    Partners: Female  Lifestyle  . Physical activity:    Days per week: Not on file    Minutes per session: Not on file  . Stress: Not on file  Relationships  . Social connections:    Talks on phone: Not on file    Gets together: Not on file    Attends religious service: Not on file    Active member of club or organization: Not on file    Attends meetings of clubs or organizations: Not on file    Relationship status: Not on file  . Intimate partner violence:    Fear of current or ex partner: Not on file    Emotionally abused: Not on file    Physically abused: Not on file    Forced sexual activity: Not on file  Other Topics Concern  . Not on file  Social History Narrative  . Not on file    Outpatient Medications Prior to Visit  Medication Sig Dispense Refill  . albuterol (PROVENTIL HFA;VENTOLIN HFA) 108 (90 BASE) MCG/ACT inhaler Inhale 2 puffs into the lungs every 6 (six) hours as needed for wheezing or shortness of breath. 1 Inhaler 0  . oxyCODONE-acetaminophen (PERCOCET) 7.5-325 MG tablet Take 1 tablet by mouth 3 (three) times daily.    . pregabalin (LYRICA) 150 MG capsule Take 150 mg by mouth 3 (three) times daily.    Marland Kitchen HYDROcodone-acetaminophen (NORCO) 7.5-325 MG per tablet Take 1 tablet by mouth every 6 (six) hours as needed for moderate pain.    . meloxicam (MOBIC) 15 MG tablet Take 1 tablet (15 mg total) by mouth daily. 15 tablet 0   No facility-administered medications prior to visit.     Allergies  Allergen Reactions  . Morphine And Related Other (See Comments)    ROS     Objective:    Physical Exam  Constitutional: He is oriented to person, place, and time. He appears  well-developed and well-nourished.  HENT:  Right Ear: Hearing, external ear and ear canal normal.  Mouth/Throat: Uvula is midline. Posterior oropharyngeal erythema present.  Eyes: Pupils are equal, round, and reactive to light. Conjunctivae are normal.  Neck: Neck supple.  Cardiovascular: Normal rate, regular rhythm and normal heart sounds.  Pulmonary/Chest: Effort normal and breath sounds normal.  Neurological: He is alert and oriented to person, place, and time.    BP 112/78   Pulse 88   Temp 98.1 F (36.7 C) (Oral)   Resp 16   Ht 5' 10.5" (1.791 m)   Wt 111.6 kg   SpO2 98%   BMI 34.80 kg/m  Wt Readings from Last 3 Encounters:  11/01/18 111.6 kg  06/01/18 105.7 kg  05/09/18 106.1 kg    Health Maintenance Due  Topic Date Due  . INFLUENZA VACCINE  05/10/2018    There are no preventive care reminders to display for this patient.   Lab Results  Component Value Date   TSH 1.48 12/15/2014   Lab Results  Component Value Date   WBC 9.8 12/12/2016   HGB 13.7 12/12/2016   HCT 40.8 12/12/2016   MCV 89.5 12/12/2016   PLT 221.0 12/12/2016   Lab Results  Component Value Date   NA 142 12/12/2016   K 4.6 12/12/2016   CO2 30 12/12/2016   GLUCOSE 82 12/12/2016   BUN 16 12/12/2016   CREATININE 0.76 12/12/2016   BILITOT 0.4 12/12/2016   ALKPHOS 85 12/12/2016   AST 14 12/12/2016   ALT 20 12/12/2016   PROT 6.6 12/12/2016   ALBUMIN 4.2 12/12/2016   CALCIUM 9.2 12/12/2016   GFR 120.40 12/12/2016   Lab Results  Component Value Date   CHOL 183 12/12/2016   Lab Results  Component Value Date   HDL 40.50 12/12/2016   Lab Results  Component Value Date   LDLCALC 114 (H) 12/12/2016   Lab Results  Component Value Date   TRIG 143.0 12/12/2016   Lab Results  Component Value Date   CHOLHDL 5 12/12/2016   Lab Results  Component Value Date   HGBA1C 5.7 12/12/2016       Assessment & Plan:   Problem List Items Addressed This Visit    None       No orders of  the defined types were placed in this encounter.    Denny Peon  E Aftin Lye, Student-PA

## 2018-11-02 ENCOUNTER — Ambulatory Visit: Payer: BLUE CROSS/BLUE SHIELD | Admitting: Physician Assistant

## 2018-11-26 DIAGNOSIS — I10 Essential (primary) hypertension: Secondary | ICD-10-CM | POA: Diagnosis not present

## 2018-11-26 DIAGNOSIS — Z6832 Body mass index (BMI) 32.0-32.9, adult: Secondary | ICD-10-CM | POA: Diagnosis not present

## 2018-11-26 DIAGNOSIS — M5126 Other intervertebral disc displacement, lumbar region: Secondary | ICD-10-CM | POA: Diagnosis not present

## 2018-11-26 DIAGNOSIS — D3610 Benign neoplasm of peripheral nerves and autonomic nervous system, unspecified: Secondary | ICD-10-CM | POA: Diagnosis not present

## 2019-02-14 DIAGNOSIS — M961 Postlaminectomy syndrome, not elsewhere classified: Secondary | ICD-10-CM | POA: Diagnosis not present

## 2019-02-14 DIAGNOSIS — D3610 Benign neoplasm of peripheral nerves and autonomic nervous system, unspecified: Secondary | ICD-10-CM | POA: Diagnosis not present

## 2019-02-24 IMAGING — CT CT RENAL STONE PROTOCOL
2 of 3 series · 16 of 46 positions shown, 18 images · non-contrast
Comparison: MRI lumbar spine dated April 01, 2015. CT abdomen pelvis
dated January 28, 2005.

CLINICAL DATA: Left flank pain for 2 weeks. History of
neurofibromatosis.

EXAM:
CT ABDOMEN AND PELVIS WITHOUT CONTRAST
TECHNIQUE: Multidetector CT imaging of the abdomen and pelvis was performed
following the standard protocol without IV contrast.

[Series 3: renal stone 5.0 · axial · 0.84mm/px · z∈[-337,+58]mm · 13 of 91 slices shown, 15 images]
[im 6/91  soft-tissue]
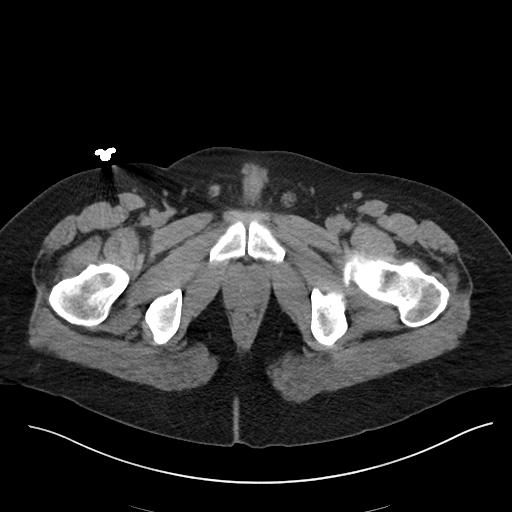
[im 6/91  bone]
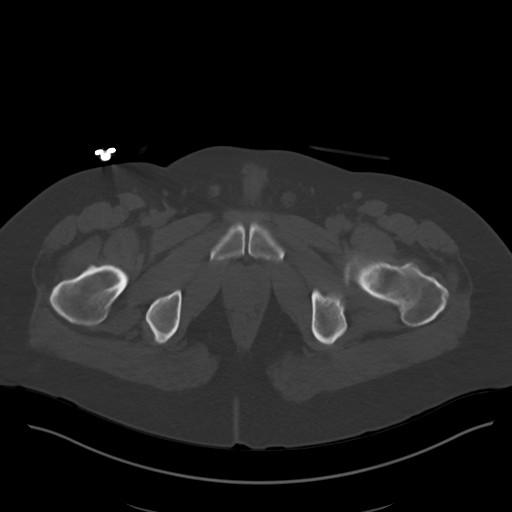
[im 12/91  soft-tissue]
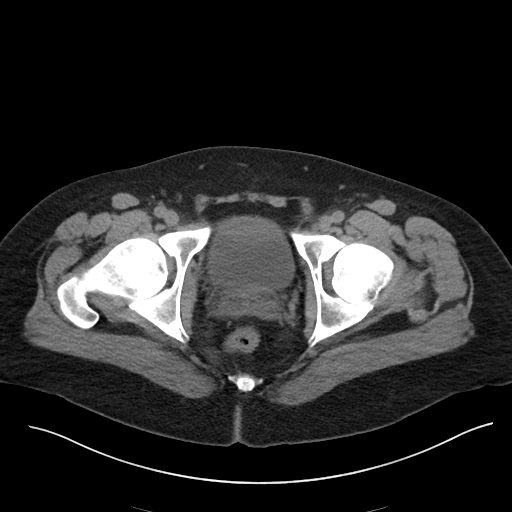
[im 18/91  soft-tissue]
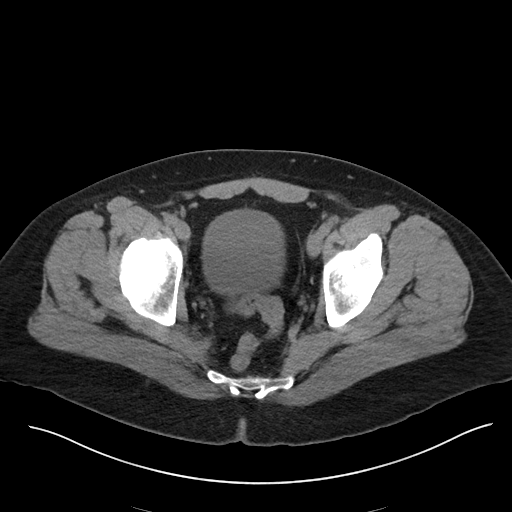
[im 27/91  soft-tissue]
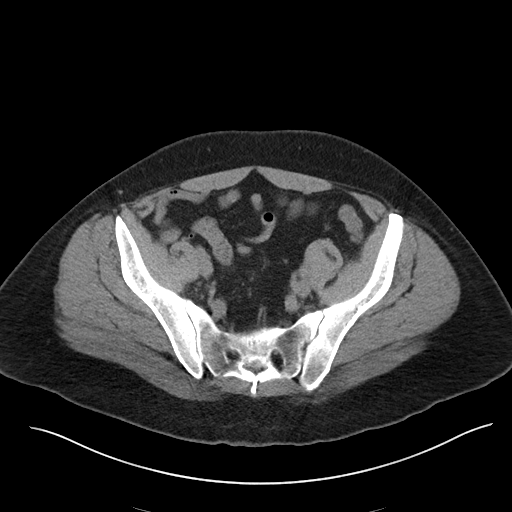
[im 32/91  soft-tissue]
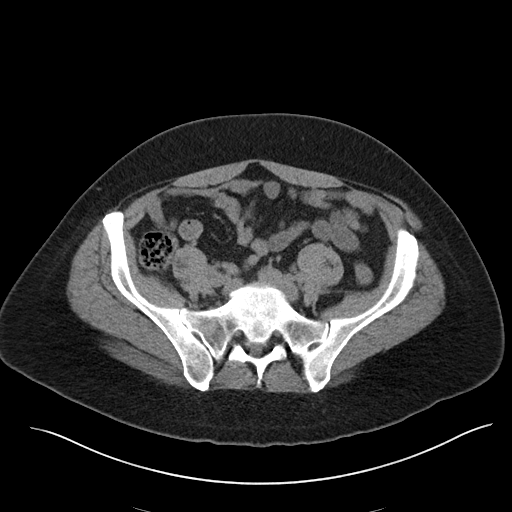
[im 38/91  soft-tissue]
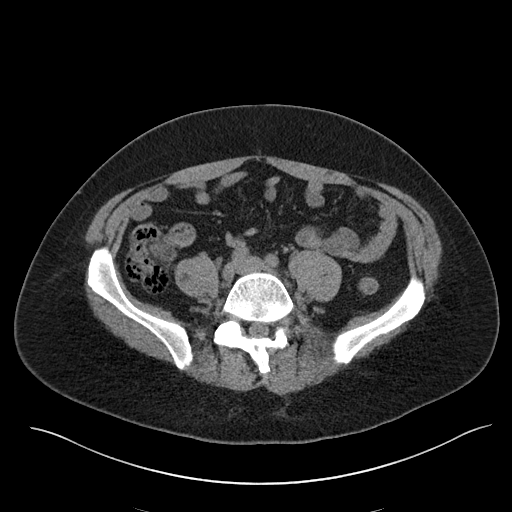
[im 47/91  soft-tissue]
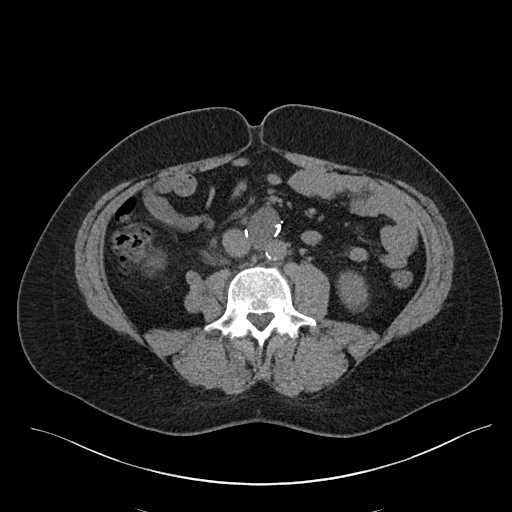
[im 53/91  soft-tissue]
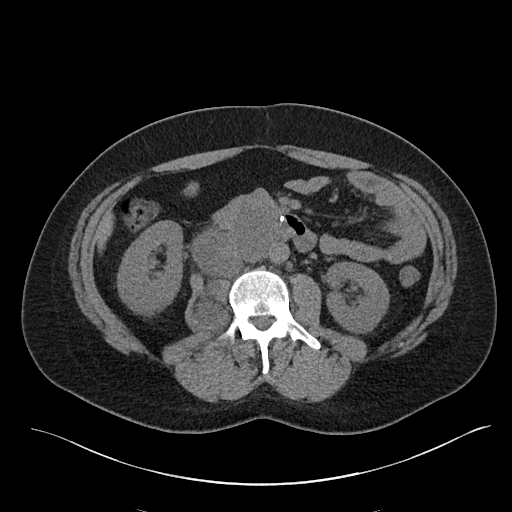
[im 59/91  soft-tissue]
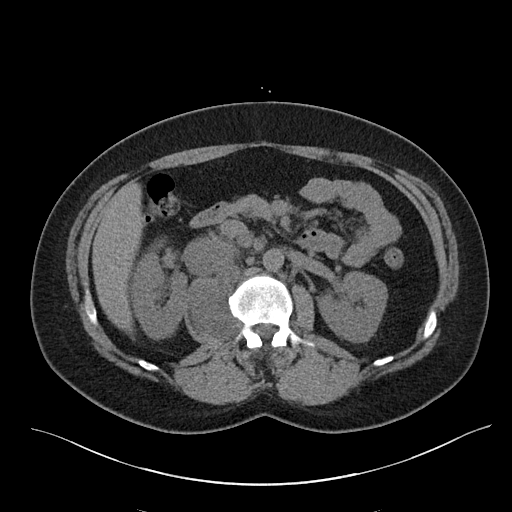
[im 59/91  bone]
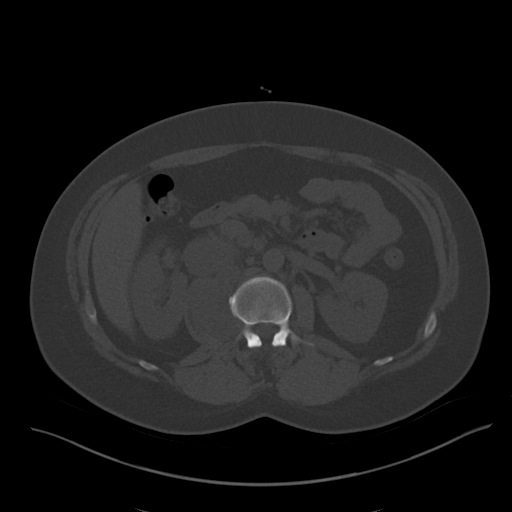
[im 64/91  soft-tissue]
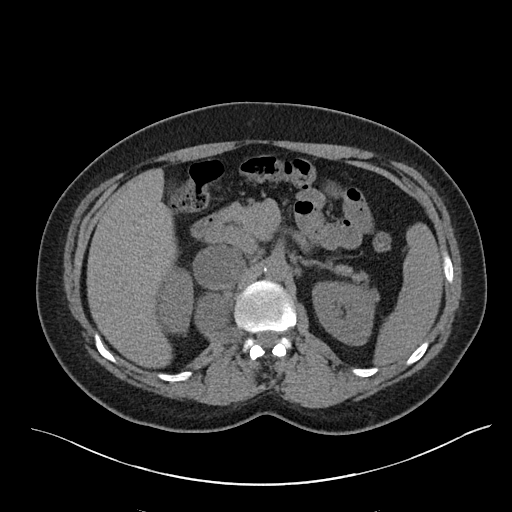
[im 73/91  soft-tissue]
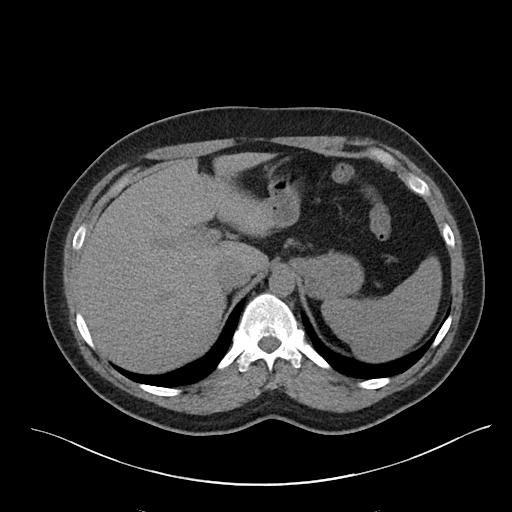
[im 79/91  soft-tissue]
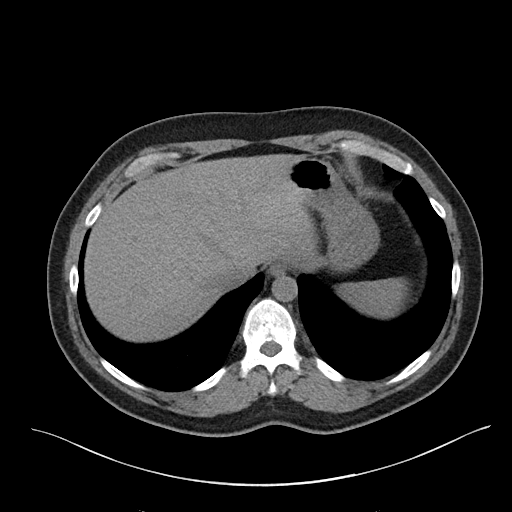
[im 85/91  soft-tissue]
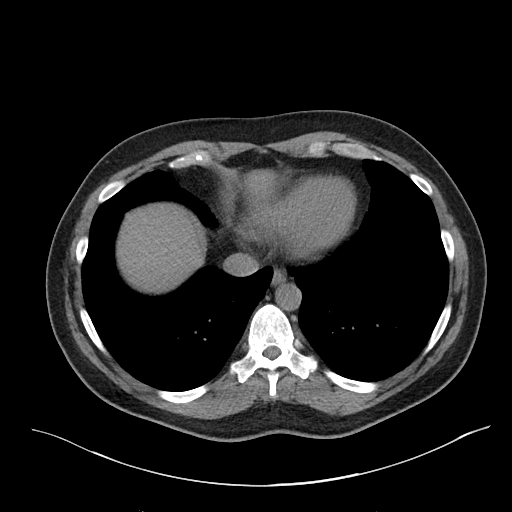

[Series 5: renal stone 3.0 cor · coronal · 0.76mm/px · 3 of 94 slices shown]
[im 32/94  soft-tissue]
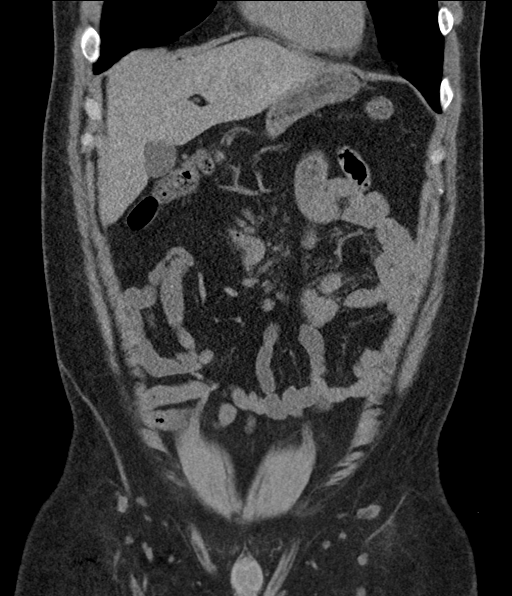
[im 42/94  soft-tissue]
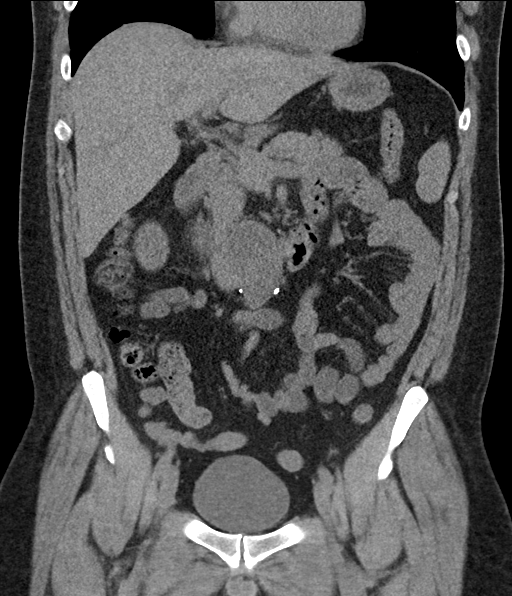
[im 52/94  soft-tissue]
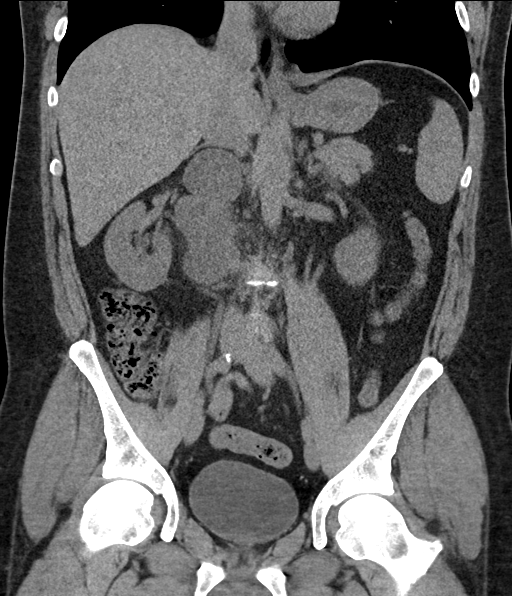

[16 of 46 positions shown; findings below may reference images not displayed]

FINDINGS: Lower chest: No acute abnormality.

Hepatobiliary: No focal liver abnormality is seen. No gallstones,
gallbladder wall thickening, or biliary dilatation.

Pancreas: Unremarkable. No pancreatic ductal dilatation or
surrounding inflammatory changes.

Spleen: Normal in size without focal abnormality.

Adrenals/Urinary Tract: Adrenal glands are unremarkable. Kidneys are
normal, without renal calculi, focal lesion, or hydronephrosis.
Bladder is unremarkable.

Stomach/Bowel: Stomach is within normal limits. Appendix appears
normal. No evidence of bowel wall thickening, distention, or
inflammatory changes.

Vascular/Lymphatic: Mild aortic atherosclerosis. No enlarged
abdominal or pelvic lymph nodes.

Reproductive: Prostate is unremarkable.

Other: Multiple right retroperitoneal mass lesions consistent with
neurofibromas have slightly increased in size when compared to prior
study. For example, the lesion adjacent to the L1 vertebral body now
measures up to 5.1 cm, previously 4.5 cm. No free fluid or
pneumoperitoneum. Unchanged small fat containing umbilical hernia.

Musculoskeletal: No acute or significant osseous findings.
IMPRESSION: 1.  No acute intra-abdominal process.  No urolithiasis.
2. Slight interval increase in size of retroperitoneal
neurofibromas.

## 2019-03-31 NOTE — Progress Notes (Signed)
Patient presents to clinic today for annual exam.  Patient is fasting for labs.  Acute Concerns: Patient endorses some throat/neck tightness x 1-46mo intermittently. Occurs only in the morning on waking and gets better as the day goes on. Throat is not sore or itchy. Reports feeling like a "pulled muscle". Feels as though this could be related to snoring. Also notes clenching jaw and history of tooth grinding at night. Not using any device currently to help with this.  Nasal congestion in the morning, blows nose each morning and then its better as the day goes on. Has hx of allergies, no daily medication for this. Denies excessive SOB, trouble swallowing, chest pain, cough. Denies nausea, vomiting, heart burn. Denies painful swallowing.   Chronic Issues: Neurofibromatosis: - Tumor pain is 3-5/10 on a good day, 6-7/10 on a bad day, reports being unable to walk when pain is so bad due to tumor location. Reports mediation does help with pain. Takes medication on a schedule despite pain. Wakes up in middle of the night 7x wk due to pain , pain so bad takes medicaiton 4x wk to help go back to sleep. Has follow-up with pain specialist in 2 weeks.   Health Maintenance: Immunizations -- UTD  Past Medical History:  Diagnosis Date  . Chickenpox   . 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 biopsy abdomen  2006  . schwannomas    . spinal tumor  2009   Benign  . WISDOM TOOTH EXTRACTION      Current Outpatient Medications on File Prior to Visit  Medication Sig Dispense Refill  . albuterol (PROVENTIL HFA;VENTOLIN HFA) 108 (90 Base) MCG/ACT inhaler Inhale 2 puffs into the lungs every 6 (six) hours as needed for wheezing or shortness of breath. 1 Inhaler 0  . oxyCODONE-acetaminophen (PERCOCET) 7.5-325 MG tablet Take 1 tablet by mouth 3 (three) times daily.    . pregabalin (LYRICA) 150 MG capsule Take 150 mg by mouth 3 (three) times daily.     No current  facility-administered medications on file prior to visit.     Allergies  Allergen Reactions  . Morphine And Related Other (See Comments)    Family History  Problem Relation Age of Onset  . Diverticulitis Mother        Living  . Thyroid cancer Mother        dx in her 72s  . Obesity Father        Living  . Other Father   . Neurofibromatosis Father        multiple schwannoma  . Neurofibromatosis Brother        multiple schwannomas  . Neurofibromatosis Paternal Uncle   . Alzheimer's disease Maternal Grandmother   . Neurofibromatosis Paternal Grandfather   . Neurofibromatosis Paternal Aunt   . Neurofibromatosis Cousin        paternal first cousin/Paternal    Social History   Socioeconomic History  . Marital status: Single    Spouse name: Not on file  . Number of children: 0  . Years of education: Not on file  . Highest education level: Not on file  Occupational History  . Occupation: Photographer  Social Needs  . Financial resource strain: Not on file  . Food insecurity    Worry: Not on file    Inability: Not on file  . Transportation needs    Medical: Not on file    Non-medical: Not on file  Tobacco Use  . Smoking status: Former Smoker    Packs/day: 1.00    Years: 3.00    Pack years: 3.00    Types: Cigarettes    Quit date: 09/30/2003    Years since quitting: 15.5  . Smokeless tobacco: Never Used  Substance and Sexual Activity  . Alcohol use: No    Alcohol/week: 0.0 standard drinks  . Drug use: Yes    Comment: rare marijuana  . Sexual activity: Yes    Partners: Female  Lifestyle  . Physical activity    Days per week: Not on file    Minutes per session: Not on file  . Stress: Not on file  Relationships  . Social Herbalist on phone: Not on file    Gets together: Not on file    Attends religious service: Not on file    Active member of club or organization: Not on file    Attends meetings of clubs or organizations: Not on file     Relationship status: Not on file  . Intimate partner violence    Fear of current or ex partner: Not on file    Emotionally abused: Not on file    Physically abused: Not on file    Forced sexual activity: Not on file  Other Topics Concern  . Not on file  Social History Narrative  . Not on file    Review of Systems  Constitutional: Negative for fever and weight loss.  HENT: Negative for ear discharge, ear pain, hearing loss and tinnitus.   Eyes: Negative for blurred vision, double vision, photophobia and pain.  Respiratory: Negative for cough and shortness of breath.   Cardiovascular: Negative for chest pain and palpitations.  Gastrointestinal: Positive for abdominal pain. Negative for blood in stool, constipation, diarrhea, heartburn, melena, nausea and vomiting.       LRQ, tumor pain   Genitourinary: Negative for dysuria, flank pain, frequency, hematuria and urgency.  Musculoskeletal: Negative for falls.  Neurological: Negative for dizziness, loss of consciousness and headaches.  Endo/Heme/Allergies: Positive for environmental allergies.  Psychiatric/Behavioral: Negative for depression, hallucinations, substance abuse and suicidal ideas. The patient is not nervous/anxious and does not have insomnia.    BP 126/80   Pulse 86   Temp 100.1 F (37.8 C) (Skin)   Resp 16   Ht 5' 10.5" (1.791 m)   Wt 243 lb 3.2 oz (110.3 kg)   SpO2 98%   BMI 34.40 kg/m   Physical Exam Vitals signs reviewed.  Constitutional:      General: He is not in acute distress.    Appearance: He is well-developed. He is not diaphoretic.  HENT:     Head: Normocephalic and atraumatic.     Right Ear: Tympanic membrane, ear canal and external ear normal.     Left Ear: Tympanic membrane, ear canal and external ear normal.     Nose: Nose normal.     Mouth/Throat:     Pharynx: No posterior oropharyngeal erythema.  Eyes:     Conjunctiva/sclera: Conjunctivae normal.     Pupils: Pupils are equal, round, and  reactive to light.  Neck:     Musculoskeletal: Neck supple.     Thyroid: No thyromegaly.  Cardiovascular:     Rate and Rhythm: Normal rate and regular rhythm.     Heart sounds: Normal heart sounds.  Pulmonary:     Effort: Pulmonary effort is normal. No respiratory distress.     Breath sounds: Normal breath sounds.  No wheezing or rales.  Chest:     Chest wall: No tenderness.  Abdominal:     General: Bowel sounds are normal. There is no distension.     Palpations: Abdomen is soft. There is no mass.     Tenderness: There is no abdominal tenderness. There is no guarding or rebound.  Lymphadenopathy:     Cervical: No cervical adenopathy.  Skin:    General: Skin is warm and dry.     Findings: No rash.  Neurological:     Mental Status: He is alert and oriented to person, place, and time.     Cranial Nerves: No cranial nerve deficit.    Assessment/Plan: 1. Visit for preventive health examination Depression screen negative. Health Maintenance reviewed. Preventive schedule discussed and handout given in AVS. Will obtain fasting labs today.  - CBC with Differential/Platelet - Comprehensive metabolic panel - Lipid panel - Hemoglobin A1c  2. Neurofibromatosis ([place]) Managed by specialist. Increase in pain but thankfully has a scheduled follow-up with pain management.  3. Seasonal allergic rhinitis, unspecified trigger Start Xyzal daily. Can consider OTC Flonase as well to help with symptoms.  4. Teeth grinding Start mouth guard at night to see if this helps with morning tension in the neck. Supportive measures and OTC medications reviewed. Follow-up if not improving.   Leeanne Rio, PA-C

## 2019-04-01 ENCOUNTER — Ambulatory Visit (INDEPENDENT_AMBULATORY_CARE_PROVIDER_SITE_OTHER): Payer: BC Managed Care – PPO | Admitting: Physician Assistant

## 2019-04-01 ENCOUNTER — Encounter: Payer: BLUE CROSS/BLUE SHIELD | Admitting: Physician Assistant

## 2019-04-01 ENCOUNTER — Encounter: Payer: Self-pay | Admitting: Physician Assistant

## 2019-04-01 ENCOUNTER — Other Ambulatory Visit: Payer: Self-pay

## 2019-04-01 VITALS — BP 126/80 | HR 86 | Temp 100.1°F | Resp 16 | Ht 70.5 in | Wt 243.2 lb

## 2019-04-01 DIAGNOSIS — F458 Other somatoform disorders: Secondary | ICD-10-CM | POA: Insufficient documentation

## 2019-04-01 DIAGNOSIS — Q8500 Neurofibromatosis, unspecified: Secondary | ICD-10-CM

## 2019-04-01 DIAGNOSIS — J302 Other seasonal allergic rhinitis: Secondary | ICD-10-CM | POA: Insufficient documentation

## 2019-04-01 DIAGNOSIS — Z0000 Encounter for general adult medical examination without abnormal findings: Secondary | ICD-10-CM

## 2019-04-01 LAB — CBC WITH DIFFERENTIAL/PLATELET
Basophils Absolute: 0 10*3/uL (ref 0.0–0.1)
Basophils Relative: 0.3 % (ref 0.0–3.0)
Eosinophils Absolute: 0.1 10*3/uL (ref 0.0–0.7)
Eosinophils Relative: 1.6 % (ref 0.0–5.0)
HCT: 43 % (ref 39.0–52.0)
Hemoglobin: 14.1 g/dL (ref 13.0–17.0)
Lymphocytes Relative: 28.5 % (ref 12.0–46.0)
Lymphs Abs: 2.2 10*3/uL (ref 0.7–4.0)
MCHC: 32.9 g/dL (ref 30.0–36.0)
MCV: 90.8 fl (ref 78.0–100.0)
Monocytes Absolute: 0.5 10*3/uL (ref 0.1–1.0)
Monocytes Relative: 6.1 % (ref 3.0–12.0)
Neutro Abs: 5 10*3/uL (ref 1.4–7.7)
Neutrophils Relative %: 63.5 % (ref 43.0–77.0)
Platelets: 217 10*3/uL (ref 150.0–400.0)
RBC: 4.73 Mil/uL (ref 4.22–5.81)
RDW: 13.8 % (ref 11.5–15.5)
WBC: 7.9 10*3/uL (ref 4.0–10.5)

## 2019-04-01 LAB — COMPREHENSIVE METABOLIC PANEL
ALT: 17 U/L (ref 0–53)
AST: 13 U/L (ref 0–37)
Albumin: 4.4 g/dL (ref 3.5–5.2)
Alkaline Phosphatase: 92 U/L (ref 39–117)
BUN: 15 mg/dL (ref 6–23)
CO2: 28 mEq/L (ref 19–32)
Calcium: 9.2 mg/dL (ref 8.4–10.5)
Chloride: 103 mEq/L (ref 96–112)
Creatinine, Ser: 0.79 mg/dL (ref 0.40–1.50)
GFR: 107.13 mL/min (ref >60.00)
Glucose, Bld: 99 mg/dL (ref 70–99)
Potassium: 3.8 mEq/L (ref 3.5–5.1)
Sodium: 139 mEq/L (ref 135–145)
Total Bilirubin: 0.2 mg/dL (ref 0.2–1.2)
Total Protein: 6.5 g/dL (ref 6.0–8.3)

## 2019-04-01 LAB — LIPID PANEL
Cholesterol: 172 mg/dL (ref 0–200)
HDL: 40.6 mg/dL (ref >39.00)
LDL Cholesterol: 103 mg/dL — ABNORMAL HIGH (ref 0–99)
NonHDL: 131.15
Total CHOL/HDL Ratio: 4
Triglycerides: 141 mg/dL (ref 0.0–149.0)
VLDL: 28.2 mg/dL (ref 0.0–40.0)

## 2019-04-01 LAB — HEMOGLOBIN A1C: Hgb A1c MFr Bld: 5.6 % (ref 4.6–6.5)

## 2019-04-01 NOTE — Patient Instructions (Signed)
Please go to the lab today for blood work.  I will call you with your results. We will alter treatment regimen(s) if indicated by your results.   Please start a daily OTC Xyzal to help with allergy symptoms. Can consider use of Flonase daily to help with nasal congestion. Get some night guards to wear at night to help with tooth grinding.  If things are not improving, please let us know and we will proceed with further assessment.    Preventive Care 40-64 Years, Male Preventive care refers to lifestyle choices and visits with your health care provider that can promote health and wellness. What does preventive care include?   A yearly physical exam. This is also called an annual well check.  Dental exams once or twice a year.  Routine eye exams. Ask your health care provider how often you should have your eyes checked.  Personal lifestyle choices, including: ? Daily care of your teeth and gums. ? Regular physical activity. ? Eating a healthy diet. ? Avoiding tobacco and drug use. ? Limiting alcohol use. ? Practicing safe sex. ? Taking low-dose aspirin every day starting at age 64. What happens during an annual well check? The services and screenings done by your health care provider during your annual well check will depend on your age, overall health, lifestyle risk factors, and family history of disease. Counseling Your health care provider may ask you questions about your:  Alcohol use.  Tobacco use.  Drug use.  Emotional well-being.  Home and relationship well-being.  Sexual activity.  Eating habits.  Work and work Statistician. Screening You may have the following tests or measurements:  Height, weight, and BMI.  Blood pressure.  Lipid and cholesterol levels. These may be checked every 5 years, or more frequently if you are over 75 years old.  Skin check.  Lung cancer screening. You may have this screening every year starting at age 8 if you have a  30-pack-year history of smoking and currently smoke or have quit within the past 15 years.  Colorectal cancer screening. All adults should have this screening starting at age 70 and continuing until age 59. Your health care provider may recommend screening at age 81. You will have tests every 1-10 years, depending on your results and the type of screening test. People at increased risk should start screening at an earlier age. Screening tests may include: ? Guaiac-based fecal occult blood testing. ? Fecal immunochemical test (FIT). ? Stool DNA test. ? Virtual colonoscopy. ? Sigmoidoscopy. During this test, a flexible tube with a tiny camera (sigmoidoscope) is used to examine your rectum and lower colon. The sigmoidoscope is inserted through your anus into your rectum and lower colon. ? Colonoscopy. During this test, a long, thin, flexible tube with a tiny camera (colonoscope) is used to examine your entire colon and rectum.  Prostate cancer screening. Recommendations will vary depending on your family history and other risks.  Hepatitis C blood test.  Hepatitis B blood test.  Sexually transmitted disease (STD) testing.  Diabetes screening. This is done by checking your blood sugar (glucose) after you have not eaten for a while (fasting). You may have this done every 1-3 years. Discuss your test results, treatment options, and if necessary, the need for more tests with your health care provider. Vaccines Your health care provider may recommend certain vaccines, such as:  Influenza vaccine. This is recommended every year.  Tetanus, diphtheria, and acellular pertussis (Tdap, Td) vaccine. You may need a  Td booster every 10 years.  Varicella vaccine. You may need this if you have not been vaccinated.  Zoster vaccine. You may need this after age 74.  Measles, mumps, and rubella (MMR) vaccine. You may need at least one dose of MMR if you were born in 1957 or later. You may also need a second  dose.  Pneumococcal 13-valent conjugate (PCV13) vaccine. You may need this if you have certain conditions and have not been vaccinated.  Pneumococcal polysaccharide (PPSV23) vaccine. You may need one or two doses if you smoke cigarettes or if you have certain conditions.  Meningococcal vaccine. You may need this if you have certain conditions.  Hepatitis A vaccine. You may need this if you have certain conditions or if you travel or work in places where you may be exposed to hepatitis A.  Hepatitis B vaccine. You may need this if you have certain conditions or if you travel or work in places where you may be exposed to hepatitis B.  Haemophilus influenzae type b (Hib) vaccine. You may need this if you have certain risk factors. Talk to your health care provider about which screenings and vaccines you need and how often you need them. This information is not intended to replace advice given to you by your health care provider. Make sure you discuss any questions you have with your health care provider. Document Released: 10/23/2015 Document Revised: 11/16/2017 Document Reviewed: 07/28/2015 Elsevier Interactive Patient Education  2019 [employer].

## 2019-04-15 DIAGNOSIS — Z6832 Body mass index (BMI) 32.0-32.9, adult: Secondary | ICD-10-CM | POA: Diagnosis not present

## 2019-04-15 DIAGNOSIS — R030 Elevated blood-pressure reading, without diagnosis of hypertension: Secondary | ICD-10-CM | POA: Diagnosis not present

## 2019-04-15 DIAGNOSIS — M5126 Other intervertebral disc displacement, lumbar region: Secondary | ICD-10-CM | POA: Diagnosis not present

## 2019-04-18 DIAGNOSIS — D3610 Benign neoplasm of peripheral nerves and autonomic nervous system, unspecified: Secondary | ICD-10-CM | POA: Diagnosis not present

## 2019-04-18 DIAGNOSIS — M545 Low back pain: Secondary | ICD-10-CM | POA: Diagnosis not present

## 2019-04-18 DIAGNOSIS — R1030 Lower abdominal pain, unspecified: Secondary | ICD-10-CM | POA: Diagnosis not present

## 2019-05-08 DIAGNOSIS — R030 Elevated blood-pressure reading, without diagnosis of hypertension: Secondary | ICD-10-CM | POA: Diagnosis not present

## 2019-05-08 DIAGNOSIS — D3610 Benign neoplasm of peripheral nerves and autonomic nervous system, unspecified: Secondary | ICD-10-CM | POA: Diagnosis not present

## 2019-05-08 DIAGNOSIS — M5416 Radiculopathy, lumbar region: Secondary | ICD-10-CM | POA: Diagnosis not present

## 2019-05-08 DIAGNOSIS — Z6833 Body mass index (BMI) 33.0-33.9, adult: Secondary | ICD-10-CM | POA: Diagnosis not present

## 2019-07-11 DIAGNOSIS — Z6833 Body mass index (BMI) 33.0-33.9, adult: Secondary | ICD-10-CM | POA: Diagnosis not present

## 2019-07-11 DIAGNOSIS — R030 Elevated blood-pressure reading, without diagnosis of hypertension: Secondary | ICD-10-CM | POA: Diagnosis not present

## 2019-07-11 DIAGNOSIS — M961 Postlaminectomy syndrome, not elsewhere classified: Secondary | ICD-10-CM | POA: Diagnosis not present

## 2019-07-11 DIAGNOSIS — M5416 Radiculopathy, lumbar region: Secondary | ICD-10-CM | POA: Diagnosis not present

## 2019-08-07 DIAGNOSIS — D2261 Melanocytic nevi of right upper limb, including shoulder: Secondary | ICD-10-CM | POA: Diagnosis not present

## 2019-08-07 DIAGNOSIS — Z85828 Personal history of other malignant neoplasm of skin: Secondary | ICD-10-CM | POA: Diagnosis not present

## 2019-08-07 DIAGNOSIS — D485 Neoplasm of uncertain behavior of skin: Secondary | ICD-10-CM | POA: Diagnosis not present

## 2019-08-07 DIAGNOSIS — D2262 Melanocytic nevi of left upper limb, including shoulder: Secondary | ICD-10-CM | POA: Diagnosis not present

## 2019-08-07 DIAGNOSIS — D225 Melanocytic nevi of trunk: Secondary | ICD-10-CM | POA: Diagnosis not present

## 2019-09-30 DIAGNOSIS — Q8500 Neurofibromatosis, unspecified: Secondary | ICD-10-CM | POA: Diagnosis not present

## 2019-09-30 DIAGNOSIS — M5416 Radiculopathy, lumbar region: Secondary | ICD-10-CM | POA: Diagnosis not present

## 2019-09-30 DIAGNOSIS — F1120 Opioid dependence, uncomplicated: Secondary | ICD-10-CM | POA: Insufficient documentation

## 2019-09-30 DIAGNOSIS — M961 Postlaminectomy syndrome, not elsewhere classified: Secondary | ICD-10-CM | POA: Diagnosis not present

## 2019-10-31 DIAGNOSIS — M5416 Radiculopathy, lumbar region: Secondary | ICD-10-CM | POA: Diagnosis not present

## 2019-11-05 DIAGNOSIS — Z03818 Encounter for observation for suspected exposure to other biological agents ruled out: Secondary | ICD-10-CM | POA: Diagnosis not present

## 2019-11-05 DIAGNOSIS — Z20822 Contact with and (suspected) exposure to covid-19: Secondary | ICD-10-CM | POA: Diagnosis not present

## 2019-11-25 DIAGNOSIS — M961 Postlaminectomy syndrome, not elsewhere classified: Secondary | ICD-10-CM | POA: Diagnosis not present

## 2019-11-25 DIAGNOSIS — R030 Elevated blood-pressure reading, without diagnosis of hypertension: Secondary | ICD-10-CM | POA: Diagnosis not present

## 2019-11-25 DIAGNOSIS — F1120 Opioid dependence, uncomplicated: Secondary | ICD-10-CM | POA: Diagnosis not present

## 2019-11-25 DIAGNOSIS — M5126 Other intervertebral disc displacement, lumbar region: Secondary | ICD-10-CM | POA: Diagnosis not present

## 2019-12-20 ENCOUNTER — Ambulatory Visit: Payer: BC Managed Care – PPO | Attending: Internal Medicine

## 2019-12-20 DIAGNOSIS — Z23 Encounter for immunization: Secondary | ICD-10-CM

## 2019-12-20 NOTE — Progress Notes (Signed)
   Covid-19 Vaccination Clinic  Name:  Eric Hansen    MRN: [id number] DOB: [birth year]  12/20/2019  Mr. Eric Hansen was observed post Covid-19 immunization for 15 minutes without incident. He was provided with Vaccine Information Sheet and instruction to access the V-Safe system.   Mr. Eric Hansen was instructed to call 911 with any severe reactions post vaccine: Marland Kitchen Difficulty breathing  . Swelling of face and throat  . A fast heartbeat  . A bad rash all over body  . Dizziness and weakness   Immunizations Administered    Name Date Dose VIS Date Route   Pfizer COVID-19 Vaccine 12/20/2019  8:59 AM 0.3 mL 09/20/2019 Intramuscular   Manufacturer: [place]   Lot: [id number]   [place]: [id number]

## 2020-01-14 ENCOUNTER — Telehealth: Payer: Self-pay

## 2020-01-14 ENCOUNTER — Ambulatory Visit: Payer: BC Managed Care – PPO

## 2020-01-14 DIAGNOSIS — Z20828 Contact with and (suspected) exposure to other viral communicable diseases: Secondary | ICD-10-CM | POA: Diagnosis not present

## 2020-01-14 DIAGNOSIS — U071 COVID-19: Secondary | ICD-10-CM | POA: Diagnosis not present

## 2020-01-14 NOTE — Telephone Encounter (Signed)
Noted. I haven't seen him for this so he needs to follow care instructions given to him by his testing site. Recommend OTc zinc supplement, 1000 unit D3 and 1000 mg Vitamin C daily. Increase fluids and get plenty of rest. Any worsening symptoms -- needs video visit with me or, if severe, would need to be seen at local urgent care or emergency room.

## 2020-01-14 NOTE — Telephone Encounter (Signed)
Called patient and instructed him per PCP instructions. Patient voiced understanding.

## 2020-01-14 NOTE — Telephone Encounter (Signed)
FYI: Patient was calling in to inform you that he tested positive for covid-19 this morning. He had the rapid test at [facility]. Current symptoms are congestion, runny nose, and he had a headache last night.

## 2020-02-06 DIAGNOSIS — M5416 Radiculopathy, lumbar region: Secondary | ICD-10-CM | POA: Diagnosis not present

## 2020-02-18 ENCOUNTER — Ambulatory Visit: Payer: BC Managed Care – PPO | Attending: Internal Medicine

## 2020-02-18 DIAGNOSIS — Z23 Encounter for immunization: Secondary | ICD-10-CM

## 2020-02-18 NOTE — Progress Notes (Signed)
   Covid-19 Vaccination Clinic  Name:  Keala Gormley    MRN: [id number] DOB: [birth year]  02/18/2020  Mr. Dade was observed post Covid-19 immunization for 15 minutes without incident. He was provided with Vaccine Information Sheet and instruction to access the V-Safe system.   Mr. Engelhard was instructed to call 911 with any severe reactions post vaccine: Marland Kitchen Difficulty breathing  . Swelling of face and throat  . A fast heartbeat  . A bad rash all over body  . Dizziness and weakness   Immunizations Administered    Name Date Dose VIS Date Route   Pfizer COVID-19 Vaccine 02/18/2020  9:06 AM 0.3 mL 12/04/2018 Intramuscular   Manufacturer: [employer], [employer]   Lot: [id number]   [place]: [id number]

## 2020-03-05 DIAGNOSIS — M961 Postlaminectomy syndrome, not elsewhere classified: Secondary | ICD-10-CM | POA: Diagnosis not present

## 2020-03-05 DIAGNOSIS — F1120 Opioid dependence, uncomplicated: Secondary | ICD-10-CM | POA: Diagnosis not present

## 2020-03-05 DIAGNOSIS — Q8500 Neurofibromatosis, unspecified: Secondary | ICD-10-CM | POA: Diagnosis not present

## 2020-03-05 DIAGNOSIS — M5126 Other intervertebral disc displacement, lumbar region: Secondary | ICD-10-CM | POA: Diagnosis not present

## 2020-03-06 ENCOUNTER — Encounter: Payer: Self-pay | Admitting: Physician Assistant

## 2020-03-06 ENCOUNTER — Ambulatory Visit (INDEPENDENT_AMBULATORY_CARE_PROVIDER_SITE_OTHER): Payer: BC Managed Care – PPO | Admitting: Physician Assistant

## 2020-03-06 ENCOUNTER — Other Ambulatory Visit: Payer: Self-pay

## 2020-03-06 VITALS — BP 118/78 | HR 88 | Temp 98.3°F | Resp 16 | Ht 70.5 in | Wt 242.0 lb

## 2020-03-06 DIAGNOSIS — R21 Rash and other nonspecific skin eruption: Secondary | ICD-10-CM | POA: Diagnosis not present

## 2020-03-06 MED ORDER — HYDROXYZINE HCL 25 MG PO TABS: 25.0000 mg | ORAL_TABLET | Freq: Three times a day (TID) | ORAL | 0 refills | Status: DC | PRN

## 2020-03-06 NOTE — Progress Notes (Signed)
Patient presents to clinic today c/o 1 week of a rash of neck, back, chest and abdomen.  Notes rash is pink in coloration and slightly scaly.  Mild pruritus noted without pain.  Denies fever, chills, headache, malaise or fatigue.  Denies change to soaps, lotions or detergents.  Does not have a pet in the home.  Denies any known exposure to plants or chemicals.  Denies sick contact.  Past Medical History:  Diagnosis Date  . Chickenpox   . 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 albuterol (PROVENTIL HFA;VENTOLIN HFA) 108 (90 Base) MCG/ACT inhaler Inhale 2 puffs into the lungs every 6 (six) hours as needed for wheezing or shortness of breath. 1 Inhaler 0  . oxyCODONE-acetaminophen (PERCOCET) 7.5-325 MG tablet Take 1 tablet by mouth 3 (three) times daily.    . pregabalin (LYRICA) 150 MG capsule Take 150 mg by mouth 3 (three) times daily.     No current facility-administered medications on file prior to visit.    Allergies  Allergen Reactions  . Morphine And Related Other (See Comments)    Family History  Problem Relation Age of Onset  . Diverticulitis Mother        Living  . Thyroid cancer Mother        dx in her 14s  . Obesity Father        Living  . Other Father   . Neurofibromatosis Father        multiple schwannoma  . Neurofibromatosis Brother        multiple schwannomas  . Neurofibromatosis Paternal Uncle   . Alzheimer's disease Maternal Grandmother   . Neurofibromatosis Paternal Grandfather   . Neurofibromatosis Paternal Aunt   . Neurofibromatosis Cousin        paternal first cousin/Paternal    Social History   Socioeconomic History  . Marital status: Single    Spouse name: Not on file  . Number of children: 0  . Years of education: Not on file  . Highest education level: Not on file  Occupational History  . Occupation: Photographer  Tobacco Use  . Smoking status: Former Smoker     Packs/day: 1.00    Years: 3.00    Pack years: 3.00    Types: Cigarettes    Quit date: 09/30/2003    Years since quitting: 16.4  . Smokeless tobacco: Never Used  Substance and Sexual Activity  . Alcohol use: No    Alcohol/week: 0.0 standard drinks  . Drug use: Yes    Comment: rare marijuana  . Sexual activity: Yes    Partners: Female  Other Topics Concern  . Not on file  Social History Narrative  . Not on file   Social Determinants of Health   Financial Resource Strain:   . Difficulty of Paying Living Expenses:   Food Insecurity:   . Worried About Charity fundraiser in the Last Year:   . Arboriculturist in the Last Year:   Transportation Needs:   . Film/video editor (Medical):   Marland Kitchen Lack of Transportation (Non-Medical):   Physical Activity:   . Days of Exercise per Week:   . Minutes of Exercise per Session:   Stress:   . Feeling of Stress :   Social Connections:   . Frequency of Communication with Friends and Family:   . Frequency of Social Gatherings with Friends and Family:   .  Attends Religious Services:   . Active Member of Clubs or Organizations:   . Attends Archivist Meetings:   Marland Kitchen Marital Status:    Review of Systems - See HPI.  All other ROS are negative.  Wt 242 lb (109.8 kg)   BMI 34.23 kg/m   Physical Exam Vitals reviewed.  Constitutional:      Appearance: Normal appearance.  HENT:     Head: Normocephalic and atraumatic.     Right Ear: Tympanic membrane normal.     Left Ear: Tympanic membrane normal.  Cardiovascular:     Rate and Rhythm: Normal rate and regular rhythm.     Pulses: Normal pulses.     Heart sounds: Normal heart sounds.  Musculoskeletal:     Cervical back: Neck supple.  Skin:    Findings: Rash present. Rash is macular and papular.     Comments: Christmas tree pattern of back, also of chest. Scattered ovoid lesions of extremities. L lower back with evidence of a resolving herald patch about 3 x 5 cm at present.    Neurological:     Mental Status: He is alert.      Assessment/Plan: 1. Pityriasis rosea-like skin eruption Herald patch of left lower back, more flush with skin likely starting to resolve. Scattered ovoid lightly erythematous papular rash in a christmas-tree pattern of the back noted. Also similar lesions of chest, abdomen, neck and a few lesions of extremities. Supportive measures and OTC medications discussed. Reassurance given. Rx vistaril to help with itch as well if needed. Follow-up for any new or worsening symptoms.  - hydrOXYzine (ATARAX/VISTARIL) 25 MG tablet; Take 1 tablet (25 mg total) by mouth 3 (three) times daily as needed.  Dispense: 30 tablet; Refill: 0  This visit occurred during the SARS-CoV-2 public health emergency.  Safety protocols were in place, including screening questions prior to the visit, additional usage of staff PPE, and extensive cleaning of exam room while observing appropriate contact time as indicated for disinfecting solutions.   Leeanne Rio, PA-C

## 2020-03-06 NOTE — Patient Instructions (Signed)
After reviewing my dermatology atlas I am apt to vote moreso for pityriasis giving the itching and the ovoid shape of each lesion, which is not tupical for tinea. As discussed this is a viral rash and will go away on its own but can take a few weeks.   I am sending in a medication called hydroxyzine to help with itch. Cold compresses and sarna lotion can also be beneficial. Keep out of the sun when possible to avoid irritating the area.   Let's watch over the next 7-10 days. Let me know if you note anything worsening or if you develop any new symptoms.    Pityriasis Rosea Pityriasis rosea is a rash that usually appears on the chest, abdomen, and back. It may also appear on the upper arms and upper legs. It usually begins as a single patch, and then more patches start to develop. The rash may cause mild itching, but it normally does not cause other problems. It usually goes away without treatment. However, it may take weeks or months for the rash to go away completely. What are the causes? The cause of this condition is not known. The condition does not spread from person to person (is not contagious). What increases the risk? This condition is more likely to develop in:  Persons aged 10-35 years.  Pregnant women. It is more common in the spring and fall seasons. What are the signs or symptoms? The main symptom of this condition is a rash.  The rash usually begins with a single oval patch that is larger than the ones that follow. This is called a herald patch. It generally appears a week or more before the rest of the rash appears.  When more patches start to develop, they spread quickly on the chest, abdomen, back, arms, and legs. These patches are smaller than the first one.  The patches that make up the rash are usually oval-shaped and pink or red in color. They are usually flat but may sometimes be raised so that they can be felt with a finger. They may also be finely crinkled and have  a scaly ring around the edge. Some people may have mild itching and nonspecific symptoms, such as:  Nausea.  Loss of appetite.  Difficulty concentrating.  Headache.  Irritability.  Sore throat.  Mild fever. How is this diagnosed? This condition may be diagnosed based on:  Your medical history and a physical exam.  Tests to rule out other causes. This may include blood tests or a test in which a small sample of skin is removed from the rash (biopsy) and checked in a lab. How is this treated?     Treatment is not usually needed for this condition. The rash will often go away on its own in 4-8 weeks. In some cases, a health care provider may recommend or prescribe medicine to reduce itching. Follow these instructions at home:  Take or apply over-the-counter and prescription medicines only as told by your health care provider.  Avoid scratching the affected areas of skin.  Do not take hot baths or use a sauna. Use only warm water when bathing or showering. Heat can increase itching. Adding cornstarch to your bath may help to relieve the itching.  Avoid exposure to the sun and other sources of UV light, such as tanning beds, as told by your health care provider. UV light may help the rash go away but may cause unwanted changes in skin color.  Keep all follow-up visits  as told by your health care provider. This is important. Contact a health care provider if:  Your rash does not go away in 8 weeks.  Your rash gets much worse.  You have a fever.  You have swelling or pain in the rash area.  You have fluid, blood, or pus coming from the rash area. Summary  Pityriasis rosea is a rash that usually appears on the trunk of the body. It can also appear on the upper arms and upper legs.  The rash usually begins with a single oval patch (herald patch) that appears a week or more before the rest of the rash appears. The herald patch is larger than the ones that follow.  The  rash may cause mild itching, but it usually does not cause other problems. It usually goes away without treatment in 4-8 weeks.  In some cases, a health care provider may recommend or prescribe medicine to reduce itching. This information is not intended to replace advice given to you by your health care provider. Make sure you discuss any questions you have with your health care provider. Document Revised: 09/25/2017 Document Reviewed: 09/25/2017 Elsevier Patient Education  2020 [employer].

## 2020-03-10 DIAGNOSIS — L42 Pityriasis rosea: Secondary | ICD-10-CM | POA: Diagnosis not present

## 2020-03-10 DIAGNOSIS — Z85828 Personal history of other malignant neoplasm of skin: Secondary | ICD-10-CM | POA: Diagnosis not present

## 2020-05-11 DIAGNOSIS — M5416 Radiculopathy, lumbar region: Secondary | ICD-10-CM | POA: Diagnosis not present

## 2020-05-19 DIAGNOSIS — M47816 Spondylosis without myelopathy or radiculopathy, lumbar region: Secondary | ICD-10-CM | POA: Diagnosis not present

## 2020-05-19 DIAGNOSIS — D3610 Benign neoplasm of peripheral nerves and autonomic nervous system, unspecified: Secondary | ICD-10-CM | POA: Diagnosis not present

## 2020-05-19 DIAGNOSIS — M5127 Other intervertebral disc displacement, lumbosacral region: Secondary | ICD-10-CM | POA: Diagnosis not present

## 2020-05-20 DIAGNOSIS — D3610 Benign neoplasm of peripheral nerves and autonomic nervous system, unspecified: Secondary | ICD-10-CM | POA: Diagnosis not present

## 2020-05-20 DIAGNOSIS — Q8500 Neurofibromatosis, unspecified: Secondary | ICD-10-CM | POA: Diagnosis not present

## 2020-05-20 DIAGNOSIS — M5416 Radiculopathy, lumbar region: Secondary | ICD-10-CM | POA: Diagnosis not present

## 2020-06-08 DIAGNOSIS — D3610 Benign neoplasm of peripheral nerves and autonomic nervous system, unspecified: Secondary | ICD-10-CM | POA: Diagnosis not present

## 2020-06-08 DIAGNOSIS — M961 Postlaminectomy syndrome, not elsewhere classified: Secondary | ICD-10-CM | POA: Diagnosis not present

## 2020-06-08 DIAGNOSIS — F1120 Opioid dependence, uncomplicated: Secondary | ICD-10-CM | POA: Diagnosis not present

## 2020-06-29 DIAGNOSIS — M5126 Other intervertebral disc displacement, lumbar region: Secondary | ICD-10-CM | POA: Diagnosis not present

## 2020-06-29 DIAGNOSIS — M5416 Radiculopathy, lumbar region: Secondary | ICD-10-CM | POA: Diagnosis not present

## 2020-07-14 DIAGNOSIS — D3610 Benign neoplasm of peripheral nerves and autonomic nervous system, unspecified: Secondary | ICD-10-CM | POA: Diagnosis not present

## 2020-07-14 DIAGNOSIS — M5416 Radiculopathy, lumbar region: Secondary | ICD-10-CM | POA: Diagnosis not present

## 2020-07-14 DIAGNOSIS — M961 Postlaminectomy syndrome, not elsewhere classified: Secondary | ICD-10-CM | POA: Diagnosis not present

## 2020-07-14 DIAGNOSIS — Q8500 Neurofibromatosis, unspecified: Secondary | ICD-10-CM | POA: Diagnosis not present

## 2020-08-13 DIAGNOSIS — B078 Other viral warts: Secondary | ICD-10-CM | POA: Diagnosis not present

## 2020-08-13 DIAGNOSIS — Z85828 Personal history of other malignant neoplasm of skin: Secondary | ICD-10-CM | POA: Diagnosis not present

## 2020-08-13 DIAGNOSIS — C44519 Basal cell carcinoma of skin of other part of trunk: Secondary | ICD-10-CM | POA: Diagnosis not present

## 2020-08-13 DIAGNOSIS — D2262 Melanocytic nevi of left upper limb, including shoulder: Secondary | ICD-10-CM | POA: Diagnosis not present

## 2020-08-13 DIAGNOSIS — D2261 Melanocytic nevi of right upper limb, including shoulder: Secondary | ICD-10-CM | POA: Diagnosis not present

## 2020-08-13 DIAGNOSIS — D225 Melanocytic nevi of trunk: Secondary | ICD-10-CM | POA: Diagnosis not present

## 2020-08-27 DIAGNOSIS — C44519 Basal cell carcinoma of skin of other part of trunk: Secondary | ICD-10-CM | POA: Diagnosis not present

## 2020-09-29 DIAGNOSIS — M5416 Radiculopathy, lumbar region: Secondary | ICD-10-CM | POA: Diagnosis not present

## 2020-10-20 DIAGNOSIS — F1120 Opioid dependence, uncomplicated: Secondary | ICD-10-CM | POA: Diagnosis not present

## 2020-10-20 DIAGNOSIS — Q8500 Neurofibromatosis, unspecified: Secondary | ICD-10-CM | POA: Diagnosis not present

## 2020-10-20 DIAGNOSIS — M961 Postlaminectomy syndrome, not elsewhere classified: Secondary | ICD-10-CM | POA: Diagnosis not present

## 2020-10-20 DIAGNOSIS — M5126 Other intervertebral disc displacement, lumbar region: Secondary | ICD-10-CM | POA: Diagnosis not present

## 2020-11-03 DIAGNOSIS — M5416 Radiculopathy, lumbar region: Secondary | ICD-10-CM | POA: Diagnosis not present

## 2020-12-02 DIAGNOSIS — Q8500 Neurofibromatosis, unspecified: Secondary | ICD-10-CM | POA: Diagnosis not present

## 2020-12-02 DIAGNOSIS — M961 Postlaminectomy syndrome, not elsewhere classified: Secondary | ICD-10-CM | POA: Diagnosis not present

## 2020-12-02 DIAGNOSIS — M5416 Radiculopathy, lumbar region: Secondary | ICD-10-CM | POA: Diagnosis not present

## 2021-01-04 DIAGNOSIS — M5416 Radiculopathy, lumbar region: Secondary | ICD-10-CM | POA: Diagnosis not present

## 2021-01-26 DIAGNOSIS — M961 Postlaminectomy syndrome, not elsewhere classified: Secondary | ICD-10-CM | POA: Diagnosis not present

## 2021-01-26 DIAGNOSIS — Z6832 Body mass index (BMI) 32.0-32.9, adult: Secondary | ICD-10-CM | POA: Diagnosis not present

## 2021-01-26 DIAGNOSIS — R030 Elevated blood-pressure reading, without diagnosis of hypertension: Secondary | ICD-10-CM | POA: Diagnosis not present

## 2021-01-26 DIAGNOSIS — M48061 Spinal stenosis, lumbar region without neurogenic claudication: Secondary | ICD-10-CM | POA: Diagnosis not present

## 2021-03-15 DIAGNOSIS — M5416 Radiculopathy, lumbar region: Secondary | ICD-10-CM | POA: Diagnosis not present

## 2021-03-15 DIAGNOSIS — M961 Postlaminectomy syndrome, not elsewhere classified: Secondary | ICD-10-CM | POA: Diagnosis not present

## 2021-03-19 ENCOUNTER — Ambulatory Visit: Payer: BC Managed Care – PPO | Admitting: Registered Nurse

## 2021-03-19 ENCOUNTER — Other Ambulatory Visit: Payer: Self-pay

## 2021-03-19 ENCOUNTER — Encounter: Payer: Self-pay | Admitting: Registered Nurse

## 2021-03-19 VITALS — BP 141/80 | HR 89 | Temp 98.0°F | Resp 18 | Ht 70.5 in | Wt 238.8 lb

## 2021-03-19 DIAGNOSIS — W57XXXA Bitten or stung by nonvenomous insect and other nonvenomous arthropods, initial encounter: Secondary | ICD-10-CM | POA: Diagnosis not present

## 2021-03-19 DIAGNOSIS — S80861A Insect bite (nonvenomous), right lower leg, initial encounter: Secondary | ICD-10-CM | POA: Diagnosis not present

## 2021-03-19 MED ORDER — DOXYCYCLINE HYCLATE 100 MG PO TABS: 200.0000 mg | ORAL_TABLET | Freq: Once | ORAL | 0 refills | Status: AC

## 2021-03-19 MED ORDER — DOXYCYCLINE HYCLATE 100 MG PO TABS: 200.0000 mg | ORAL_TABLET | Freq: Two times a day (BID) | ORAL | 0 refills | Status: DC

## 2021-03-19 NOTE — Patient Instructions (Signed)
° ° ° °  If you have lab work done today you will be contacted with your lab results within the next 2 weeks.  If you have not heard from us then please contact us. The fastest way to get your results is to register for My Chart. ° ° °IF you received an x-ray today, you will receive an invoice from [facility]. Please contact [facility] at [phone] with questions or concerns regarding your invoice.  ° °IF you received labwork today, you will receive an invoice from [facility]. Please contact [facility] at [phone] with questions or concerns regarding your invoice.  ° °Our billing staff will not be able to assist you with questions regarding bills from these companies. ° °You will be contacted with the lab results as soon as they are available. The fastest way to get your results is to activate your My Chart account. Instructions are located on the last page of this paperwork. If you have not heard from us regarding the results in 2 weeks, please contact this office. °  ° ° ° °

## 2021-03-19 NOTE — Progress Notes (Signed)
Established Patient Office Visit  Subjective:  Patient ID: Eric Hansen, male    DOB: 05/16/1976  Age: 45 y.o. MRN: [id number]  CC:  Chief Complaint  Patient presents with  . Transitions Of Care    Patient states he is here for TOC. Also has a tick bite on right leg.    HPI Ramaj Frangos presents for [facility]  Histories reviewed and updated with patient.  Reveiwed recent labs with patient   Tick bite Right leg Removed tick when still flat No bullseye rash No systemic symptoms. Feeling well overall. Curious to next steps.  Past Medical History:  Diagnosis Date  . Chickenpox   . 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 biopsy abdomen  2006  . schwannomas    . spinal tumor  2009   Benign  . WISDOM TOOTH EXTRACTION      Family History  Problem Relation Age of Onset  . Alzheimer's disease Mother   . Diverticulitis Mother        Living  . Thyroid cancer Mother        dx in her 83s  . Obesity Father        Living  . Other Father   . Neurofibromatosis Father        multiple schwannoma  . Neurofibromatosis Brother        multiple schwannomas  . Alzheimer's disease Maternal Grandmother   . Neurofibromatosis Paternal Grandfather   . Neurofibromatosis Paternal Aunt   . Neurofibromatosis Paternal Uncle   . Neurofibromatosis Cousin        paternal first cousin/Paternal    Social History   Socioeconomic History  . Marital status: Single    Spouse name: Not on file  . Number of children: 0  . Years of education: Not on file  . Highest education level: Not on file  Occupational History  . Occupation: Photographer  Tobacco Use  . Smoking status: Former    Packs/day: 1.00    Years: 3.00    Pack years: 3.00    Types: Cigarettes    Quit date: 09/26/2003    Years since quitting: 17.4  . Smokeless tobacco: Never  Vaping Use  . Vaping Use: Never used  Substance and Sexual Activity  . Alcohol use: No    Alcohol/week:  0.0 standard drinks  . Drug use: Yes    Comment: rare marijuana  . Sexual activity: Yes    Partners: Female  Other Topics Concern  . Not on file  Social History Narrative  . Not on file   Social Determinants of Health   Financial Resource Strain: Not on file  Food Insecurity: Not on file  Transportation Needs: Not on file  Physical Activity: Not on file  Stress: Not on file  Social Connections: Not on file  Intimate Partner Violence: Not on file    Outpatient Medications Prior to Visit  Medication Sig Dispense Refill  . albuterol (PROVENTIL HFA;VENTOLIN HFA) 108 (90 Base) MCG/ACT inhaler Inhale 2 puffs into the lungs every 6 (six) hours as needed for wheezing or shortness of breath. 1 Inhaler 0  . oxyCODONE-acetaminophen (PERCOCET) 7.5-325 MG tablet Take 1 tablet by mouth 3 (three) times daily.    . pregabalin (LYRICA) 150 MG capsule Take 150 mg by mouth 3 (three) times daily.    Marland Kitchen XTAMPZA ER 13.5 MG C12A Take 1 capsule by mouth every 12 (twelve) hours.    . hydrOXYzine (  ATARAX/VISTARIL) 25 MG tablet Take 1 tablet (25 mg total) by mouth 3 (three) times daily as needed. 30 tablet 0   No facility-administered medications prior to visit.    Allergies  Allergen Reactions  . Morphine And Related Other (See Comments)    ROS Review of Systems  Constitutional: Negative.   HENT: Negative.    Eyes: Negative.   Respiratory: Negative.    Cardiovascular: Negative.   Gastrointestinal: Negative.   Genitourinary: Negative.   Musculoskeletal: Negative.   Skin: Negative.   Neurological: Negative.   Psychiatric/Behavioral: Negative.    All other systems reviewed and are negative.    Objective:    Physical Exam Constitutional:      General: He is not in acute distress.    Appearance: Normal appearance. He is normal weight. He is not ill-appearing, toxic-appearing or diaphoretic.  Cardiovascular:     Rate and Rhythm: Normal rate and regular rhythm.     Heart sounds: Normal  heart sounds. No murmur heard.   No friction rub. No gallop.  Pulmonary:     Effort: Pulmonary effort is normal. No respiratory distress.     Breath sounds: Normal breath sounds. No stridor. No wheezing, rhonchi or rales.  Chest:     Chest wall: No tenderness.  Skin:    Findings: Lesion (site where tick was latched is reddish, poorly circumscribed. no drainage or swelling. mildly warm) present.  Neurological:     General: No focal deficit present.     Mental Status: He is alert and oriented to person, place, and time. Mental status is at baseline.  Psychiatric:        Mood and Affect: Mood normal.        Behavior: Behavior normal.        Thought Content: Thought content normal.        Judgment: Judgment normal.    BP (!) 141/80   Pulse 89   Temp 98 F (36.7 C) (Temporal)   Resp 18   Ht 5' 10.5" (1.791 m)   Wt 238 lb 12.8 oz (108.3 kg)   SpO2 99%   BMI 33.78 kg/m  Wt Readings from Last 3 Encounters:  03/19/21 238 lb 12.8 oz (108.3 kg)  03/06/20 242 lb (109.8 kg)  04/01/19 243 lb 3.2 oz (110.3 kg)     Health Maintenance Due  Topic Date Due  . Zoster Vaccines- Shingrix (1 of 2) Never done  . COVID-19 Vaccine (3 - Pfizer risk series) 03/17/2020    There are no preventive care reminders to display for this patient.  Lab Results  Component Value Date   TSH 1.48 12/15/2014   Lab Results  Component Value Date   WBC 7.9 04/01/2019   HGB 14.1 04/01/2019   HCT 43.0 04/01/2019   MCV 90.8 04/01/2019   PLT 217.0 04/01/2019   Lab Results  Component Value Date   NA 139 04/01/2019   K 3.8 04/01/2019   CO2 28 04/01/2019   GLUCOSE 99 04/01/2019   BUN 15 04/01/2019   CREATININE 0.79 04/01/2019   BILITOT 0.2 04/01/2019   ALKPHOS 92 04/01/2019   AST 13 04/01/2019   ALT 17 04/01/2019   PROT 6.5 04/01/2019   ALBUMIN 4.4 04/01/2019   CALCIUM 9.2 04/01/2019   GFR 107.13 04/01/2019   Lab Results  Component Value Date   CHOL 172 04/01/2019   Lab Results  Component  Value Date   HDL 40.60 04/01/2019   Lab Results  Component Value Date  [place] 103 (H) 04/01/2019   Lab Results  Component Value Date   TRIG 141.0 04/01/2019   Lab Results  Component Value Date   CHOLHDL 4 04/01/2019   Lab Results  Component Value Date   HGBA1C 5.6 04/01/2019      Assessment & Plan:   Problem List Items Addressed This Visit   None Visit Diagnoses     Tick bite, initial encounter    -  Primary   Relevant Medications   doxycycline (VIBRA-TABS) 100 MG tablet       Meds ordered this encounter  Medications  . DISCONTD: doxycycline (VIBRA-TABS) 100 MG tablet    Sig: Take 2 tablets (200 mg total) by mouth 2 (two) times daily.    Dispense:  2 tablet    Refill:  0    Order Specific Question:   Supervising Provider    Answer:   Carlota Raspberry, JEFFREY R [[id number]]  . doxycycline (VIBRA-TABS) 100 MG tablet    Sig: Take 2 tablets (200 mg total) by mouth once for 1 dose.    Dispense:  2 tablet    Refill:  0    Order Specific Question:   Supervising Provider    Answer:   Carlota Raspberry, JEFFREY R [[id number]]    Follow-up: No follow-ups on file.   PLAN Prophylactic abx as above Suggest pt return for cpe and labs at appropriate interval. Patient encouraged to call clinic with any questions, comments, or concerns.   Maximiano Coss, NP

## 2021-04-13 DIAGNOSIS — F1120 Opioid dependence, uncomplicated: Secondary | ICD-10-CM | POA: Diagnosis not present

## 2021-04-13 DIAGNOSIS — M48061 Spinal stenosis, lumbar region without neurogenic claudication: Secondary | ICD-10-CM | POA: Diagnosis not present

## 2021-04-13 DIAGNOSIS — M961 Postlaminectomy syndrome, not elsewhere classified: Secondary | ICD-10-CM | POA: Diagnosis not present

## 2021-05-18 DIAGNOSIS — D3610 Benign neoplasm of peripheral nerves and autonomic nervous system, unspecified: Secondary | ICD-10-CM | POA: Diagnosis not present

## 2021-05-18 DIAGNOSIS — M545 Low back pain, unspecified: Secondary | ICD-10-CM | POA: Diagnosis not present

## 2021-06-15 DIAGNOSIS — M5416 Radiculopathy, lumbar region: Secondary | ICD-10-CM | POA: Diagnosis not present

## 2021-06-28 DIAGNOSIS — M961 Postlaminectomy syndrome, not elsewhere classified: Secondary | ICD-10-CM | POA: Diagnosis not present

## 2021-06-28 DIAGNOSIS — Q8500 Neurofibromatosis, unspecified: Secondary | ICD-10-CM | POA: Diagnosis not present

## 2021-06-28 DIAGNOSIS — D3610 Benign neoplasm of peripheral nerves and autonomic nervous system, unspecified: Secondary | ICD-10-CM | POA: Diagnosis not present

## 2021-06-28 DIAGNOSIS — F1120 Opioid dependence, uncomplicated: Secondary | ICD-10-CM | POA: Diagnosis not present

## 2021-06-29 DIAGNOSIS — Q8500 Neurofibromatosis, unspecified: Secondary | ICD-10-CM | POA: Diagnosis not present

## 2021-06-29 DIAGNOSIS — M5416 Radiculopathy, lumbar region: Secondary | ICD-10-CM | POA: Diagnosis not present

## 2021-06-29 DIAGNOSIS — F1120 Opioid dependence, uncomplicated: Secondary | ICD-10-CM | POA: Diagnosis not present

## 2021-06-29 DIAGNOSIS — M961 Postlaminectomy syndrome, not elsewhere classified: Secondary | ICD-10-CM | POA: Diagnosis not present

## 2021-08-18 DIAGNOSIS — Z85828 Personal history of other malignant neoplasm of skin: Secondary | ICD-10-CM | POA: Diagnosis not present

## 2021-08-18 DIAGNOSIS — D2262 Melanocytic nevi of left upper limb, including shoulder: Secondary | ICD-10-CM | POA: Diagnosis not present

## 2021-08-18 DIAGNOSIS — D2261 Melanocytic nevi of right upper limb, including shoulder: Secondary | ICD-10-CM | POA: Diagnosis not present

## 2021-08-18 DIAGNOSIS — L570 Actinic keratosis: Secondary | ICD-10-CM | POA: Diagnosis not present

## 2021-08-18 DIAGNOSIS — L821 Other seborrheic keratosis: Secondary | ICD-10-CM | POA: Diagnosis not present

## 2021-09-14 DIAGNOSIS — M5416 Radiculopathy, lumbar region: Secondary | ICD-10-CM | POA: Diagnosis not present

## 2021-09-21 DIAGNOSIS — F1120 Opioid dependence, uncomplicated: Secondary | ICD-10-CM | POA: Diagnosis not present

## 2021-09-21 DIAGNOSIS — M961 Postlaminectomy syndrome, not elsewhere classified: Secondary | ICD-10-CM | POA: Diagnosis not present

## 2021-09-21 DIAGNOSIS — M5416 Radiculopathy, lumbar region: Secondary | ICD-10-CM | POA: Diagnosis not present

## 2021-09-21 DIAGNOSIS — D3610 Benign neoplasm of peripheral nerves and autonomic nervous system, unspecified: Secondary | ICD-10-CM | POA: Diagnosis not present

## 2021-12-07 DIAGNOSIS — Q8500 Neurofibromatosis, unspecified: Secondary | ICD-10-CM | POA: Diagnosis not present

## 2021-12-07 DIAGNOSIS — F1120 Opioid dependence, uncomplicated: Secondary | ICD-10-CM | POA: Diagnosis not present

## 2021-12-07 DIAGNOSIS — M961 Postlaminectomy syndrome, not elsewhere classified: Secondary | ICD-10-CM | POA: Diagnosis not present

## 2021-12-07 DIAGNOSIS — M5416 Radiculopathy, lumbar region: Secondary | ICD-10-CM | POA: Diagnosis not present

## 2021-12-14 DIAGNOSIS — M5416 Radiculopathy, lumbar region: Secondary | ICD-10-CM | POA: Diagnosis not present

## 2022-02-22 DIAGNOSIS — M961 Postlaminectomy syndrome, not elsewhere classified: Secondary | ICD-10-CM | POA: Diagnosis not present

## 2022-02-22 DIAGNOSIS — D3610 Benign neoplasm of peripheral nerves and autonomic nervous system, unspecified: Secondary | ICD-10-CM | POA: Diagnosis not present

## 2022-02-22 DIAGNOSIS — M5416 Radiculopathy, lumbar region: Secondary | ICD-10-CM | POA: Diagnosis not present

## 2022-02-22 DIAGNOSIS — Q8500 Neurofibromatosis, unspecified: Secondary | ICD-10-CM | POA: Diagnosis not present

## 2022-03-21 DIAGNOSIS — M5416 Radiculopathy, lumbar region: Secondary | ICD-10-CM | POA: Diagnosis not present

## 2022-05-09 DIAGNOSIS — F1120 Opioid dependence, uncomplicated: Secondary | ICD-10-CM | POA: Diagnosis not present

## 2022-05-09 DIAGNOSIS — M5416 Radiculopathy, lumbar region: Secondary | ICD-10-CM | POA: Diagnosis not present

## 2022-05-09 DIAGNOSIS — Z6834 Body mass index (BMI) 34.0-34.9, adult: Secondary | ICD-10-CM | POA: Diagnosis not present

## 2022-05-09 DIAGNOSIS — M961 Postlaminectomy syndrome, not elsewhere classified: Secondary | ICD-10-CM | POA: Diagnosis not present

## 2022-06-27 DIAGNOSIS — M5416 Radiculopathy, lumbar region: Secondary | ICD-10-CM | POA: Diagnosis not present

## 2022-07-25 DIAGNOSIS — D3610 Benign neoplasm of peripheral nerves and autonomic nervous system, unspecified: Secondary | ICD-10-CM | POA: Diagnosis not present

## 2022-07-25 DIAGNOSIS — F1120 Opioid dependence, uncomplicated: Secondary | ICD-10-CM | POA: Diagnosis not present

## 2022-07-25 DIAGNOSIS — M5416 Radiculopathy, lumbar region: Secondary | ICD-10-CM | POA: Diagnosis not present

## 2022-07-25 DIAGNOSIS — M961 Postlaminectomy syndrome, not elsewhere classified: Secondary | ICD-10-CM | POA: Diagnosis not present

## 2022-08-29 DIAGNOSIS — L853 Xerosis cutis: Secondary | ICD-10-CM | POA: Diagnosis not present

## 2022-08-29 DIAGNOSIS — L821 Other seborrheic keratosis: Secondary | ICD-10-CM | POA: Diagnosis not present

## 2022-08-29 DIAGNOSIS — Z85828 Personal history of other malignant neoplasm of skin: Secondary | ICD-10-CM | POA: Diagnosis not present

## 2022-08-29 DIAGNOSIS — L820 Inflamed seborrheic keratosis: Secondary | ICD-10-CM | POA: Diagnosis not present

## 2022-10-17 DIAGNOSIS — M5416 Radiculopathy, lumbar region: Secondary | ICD-10-CM | POA: Diagnosis not present

## 2022-11-22 DIAGNOSIS — F1120 Opioid dependence, uncomplicated: Secondary | ICD-10-CM | POA: Diagnosis not present

## 2022-11-22 DIAGNOSIS — M5416 Radiculopathy, lumbar region: Secondary | ICD-10-CM | POA: Diagnosis not present

## 2022-11-22 DIAGNOSIS — M961 Postlaminectomy syndrome, not elsewhere classified: Secondary | ICD-10-CM | POA: Diagnosis not present

## 2022-11-22 DIAGNOSIS — Z6832 Body mass index (BMI) 32.0-32.9, adult: Secondary | ICD-10-CM | POA: Diagnosis not present

## 2023-01-17 DIAGNOSIS — M5416 Radiculopathy, lumbar region: Secondary | ICD-10-CM | POA: Diagnosis not present

## 2023-02-21 DIAGNOSIS — M5416 Radiculopathy, lumbar region: Secondary | ICD-10-CM | POA: Diagnosis not present

## 2023-02-21 DIAGNOSIS — Z6831 Body mass index (BMI) 31.0-31.9, adult: Secondary | ICD-10-CM | POA: Diagnosis not present

## 2023-02-21 DIAGNOSIS — F1120 Opioid dependence, uncomplicated: Secondary | ICD-10-CM | POA: Diagnosis not present

## 2023-02-21 DIAGNOSIS — M961 Postlaminectomy syndrome, not elsewhere classified: Secondary | ICD-10-CM | POA: Diagnosis not present

## 2023-05-03 DIAGNOSIS — M5416 Radiculopathy, lumbar region: Secondary | ICD-10-CM | POA: Diagnosis not present

## 2023-05-22 DIAGNOSIS — M5416 Radiculopathy, lumbar region: Secondary | ICD-10-CM | POA: Diagnosis not present

## 2023-06-06 DIAGNOSIS — M5416 Radiculopathy, lumbar region: Secondary | ICD-10-CM | POA: Diagnosis not present

## 2023-07-11 ENCOUNTER — Ambulatory Visit: Payer: BC Managed Care – PPO | Admitting: Physician Assistant

## 2023-07-11 VITALS — BP 126/68 | HR 87 | Temp 98.6°F | Resp 16 | Ht 71.0 in | Wt 236.5 lb

## 2023-07-11 DIAGNOSIS — F339 Major depressive disorder, recurrent, unspecified: Secondary | ICD-10-CM | POA: Diagnosis not present

## 2023-07-11 DIAGNOSIS — F1120 Opioid dependence, uncomplicated: Secondary | ICD-10-CM | POA: Diagnosis not present

## 2023-07-11 DIAGNOSIS — Z23 Encounter for immunization: Secondary | ICD-10-CM

## 2023-07-11 DIAGNOSIS — Z1211 Encounter for screening for malignant neoplasm of colon: Secondary | ICD-10-CM

## 2023-07-11 DIAGNOSIS — Z0000 Encounter for general adult medical examination without abnormal findings: Secondary | ICD-10-CM | POA: Diagnosis not present

## 2023-07-11 DIAGNOSIS — M5416 Radiculopathy, lumbar region: Secondary | ICD-10-CM | POA: Diagnosis not present

## 2023-07-11 LAB — CBC WITH DIFFERENTIAL/PLATELET
Basophils Absolute: 0 10*3/uL (ref 0.0–0.1)
Basophils Relative: 0.5 % (ref 0.0–3.0)
Eosinophils Absolute: 0.1 10*3/uL (ref 0.0–0.7)
Eosinophils Relative: 1.5 % (ref 0.0–5.0)
HCT: 41.2 % (ref 39.0–52.0)
Hemoglobin: 13.4 g/dL (ref 13.0–17.0)
Lymphocytes Relative: 35.7 % (ref 12.0–46.0)
Lymphs Abs: 3.1 10*3/uL (ref 0.7–4.0)
MCHC: 32.4 g/dL (ref 30.0–36.0)
MCV: 89.6 fL (ref 78.0–100.0)
Monocytes Absolute: 0.5 10*3/uL (ref 0.1–1.0)
Monocytes Relative: 6 % (ref 3.0–12.0)
Neutro Abs: 4.8 10*3/uL (ref 1.4–7.7)
Neutrophils Relative %: 56.3 % (ref 43.0–77.0)
Platelets: 222 10*3/uL (ref 150.0–400.0)
RBC: 4.6 Mil/uL (ref 4.22–5.81)
RDW: 14.7 % (ref 11.5–15.5)
WBC: 8.6 10*3/uL (ref 4.0–10.5)

## 2023-07-11 LAB — COMPREHENSIVE METABOLIC PANEL
ALT: 13 U/L (ref 0–53)
AST: 11 U/L (ref 0–37)
Albumin: 4.3 g/dL (ref 3.5–5.2)
Alkaline Phosphatase: 87 U/L (ref 39–117)
BUN: 16 mg/dL (ref 6–23)
CO2: 29 meq/L (ref 19–32)
Calcium: 9.3 mg/dL (ref 8.4–10.5)
Chloride: 104 meq/L (ref 96–112)
Creatinine, Ser: 0.8 mg/dL (ref 0.40–1.50)
GFR: 105.68 mL/min (ref >60.00)
Glucose, Bld: 87 mg/dL (ref 70–99)
Potassium: 4.2 meq/L (ref 3.5–5.1)
Sodium: 140 meq/L (ref 135–145)
Total Bilirubin: 0.4 mg/dL (ref 0.2–1.2)
Total Protein: 6.8 g/dL (ref 6.0–8.3)

## 2023-07-11 LAB — LIPID PANEL
Cholesterol: 178 mg/dL (ref 0–200)
HDL: 41.3 mg/dL
LDL Cholesterol: 111 mg/dL — ABNORMAL HIGH (ref 0–99)
NonHDL: 136.82
Total CHOL/HDL Ratio: 4
Triglycerides: 131 mg/dL (ref 0.0–149.0)
VLDL: 26.2 mg/dL (ref 0.0–40.0)

## 2023-07-11 LAB — HEMOGLOBIN A1C: Hgb A1c MFr Bld: 5.8 % (ref 4.6–6.5)

## 2023-07-11 NOTE — Assessment & Plan Note (Signed)
Followed by pain management. 
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
## 2023-07-11 NOTE — Assessment & Plan Note (Signed)
Patient reports ongoing pain related to neurofibromatosis and tumors. Currently managed by a pain specialist with Xtampza and hydrocodone. Recent insurance issues have led to inconsistent access to Xtampza, leading to increased use of hydrocodone. -Advise patient to discuss with pain management provider about temporary increase in hydrocodone prescription due to lack of Xtampza.

## 2023-07-11 NOTE — Assessment & Plan Note (Signed)
Patient reports history of depression, currently managed with Duloxetine 30mg . Recent stressors include caring for mother with dementia and chronic pain. -Recommend patient discuss increasing Duloxetine to 60mg  with pain management provider at next visit.

## 2023-08-22 DIAGNOSIS — Q8500 Neurofibromatosis, unspecified: Secondary | ICD-10-CM | POA: Diagnosis not present

## 2023-08-22 DIAGNOSIS — M5416 Radiculopathy, lumbar region: Secondary | ICD-10-CM | POA: Diagnosis not present

## 2023-08-22 DIAGNOSIS — H52223 Regular astigmatism, bilateral: Secondary | ICD-10-CM | POA: Diagnosis not present

## 2023-08-22 DIAGNOSIS — M961 Postlaminectomy syndrome, not elsewhere classified: Secondary | ICD-10-CM | POA: Diagnosis not present

## 2023-08-22 DIAGNOSIS — F1120 Opioid dependence, uncomplicated: Secondary | ICD-10-CM | POA: Diagnosis not present

## 2023-10-23 DIAGNOSIS — M5416 Radiculopathy, lumbar region: Secondary | ICD-10-CM | POA: Diagnosis not present

## 2023-10-24 DIAGNOSIS — D2262 Melanocytic nevi of left upper limb, including shoulder: Secondary | ICD-10-CM | POA: Diagnosis not present

## 2023-10-24 DIAGNOSIS — D2261 Melanocytic nevi of right upper limb, including shoulder: Secondary | ICD-10-CM | POA: Diagnosis not present

## 2023-10-24 DIAGNOSIS — C44519 Basal cell carcinoma of skin of other part of trunk: Secondary | ICD-10-CM | POA: Diagnosis not present

## 2023-10-24 DIAGNOSIS — Z85828 Personal history of other malignant neoplasm of skin: Secondary | ICD-10-CM | POA: Diagnosis not present

## 2023-10-24 DIAGNOSIS — L570 Actinic keratosis: Secondary | ICD-10-CM | POA: Diagnosis not present

## 2023-10-24 DIAGNOSIS — D225 Melanocytic nevi of trunk: Secondary | ICD-10-CM | POA: Diagnosis not present

## 2023-10-24 DIAGNOSIS — L814 Other melanin hyperpigmentation: Secondary | ICD-10-CM | POA: Diagnosis not present

## 2023-10-24 DIAGNOSIS — D485 Neoplasm of uncertain behavior of skin: Secondary | ICD-10-CM | POA: Diagnosis not present

## 2023-11-28 DIAGNOSIS — M961 Postlaminectomy syndrome, not elsewhere classified: Secondary | ICD-10-CM | POA: Diagnosis not present

## 2023-11-28 DIAGNOSIS — Q8500 Neurofibromatosis, unspecified: Secondary | ICD-10-CM | POA: Diagnosis not present

## 2023-11-28 DIAGNOSIS — F1120 Opioid dependence, uncomplicated: Secondary | ICD-10-CM | POA: Diagnosis not present

## 2023-11-28 DIAGNOSIS — M5416 Radiculopathy, lumbar region: Secondary | ICD-10-CM | POA: Diagnosis not present

## 2023-12-26 ENCOUNTER — Encounter: Payer: Self-pay | Admitting: Physician Assistant

## 2023-12-26 ENCOUNTER — Ambulatory Visit: Admitting: Physician Assistant

## 2023-12-26 VITALS — BP 134/81 | HR 80 | Ht 71.0 in | Wt 233.2 lb

## 2023-12-26 DIAGNOSIS — M25522 Pain in left elbow: Secondary | ICD-10-CM

## 2023-12-26 NOTE — Progress Notes (Signed)
      Established patient visit   Patient: Eric Hansen   DOB: [birth year]   48 y.o. Male  MRN: [id number] Visit Date: 12/26/2023  Today's healthcare provider: Alfredia Ferguson, PA-C   Cc. L elbow pain  Subjective     Discussed the use of AI scribe software for clinical note transcription with the patient, who gave verbal consent to proceed.  History of Present Illness   The patient presents with right elbow pain and limited range of motion after lifting heavy boxes last Thursday, x 6 days ago. The pain is localized to the outer aspect of the elbow and is exacerbated by full extension and flexion. The patient reports no associated numbness or tingling in the hand.   Medications: Outpatient Medications Prior to Visit  Medication Sig   DULoxetine (CYMBALTA) 30 MG capsule Take 30 mg by mouth daily.   pregabalin (LYRICA) 150 MG capsule Take 150 mg by mouth 3 (three) times daily.   XTAMPZA ER 13.5 MG C12A Take 1 capsule by mouth every 12 (twelve) hours. (Patient not taking: Reported on 07/11/2023)   No facility-administered medications prior to visit.   Review of Systems  Constitutional:  Negative for fatigue and fever.  Respiratory:  Negative for cough and shortness of breath.   Cardiovascular:  Negative for chest pain, palpitations and leg swelling.  Musculoskeletal:  Positive for arthralgias.  Neurological:  Negative for dizziness and headaches.       Objective    BP 134/81   Pulse 80   Ht 5\' 11"  (1.803 m)   Wt 233 lb 3.2 oz (105.8 kg)   BMI 32.52 kg/m    Physical Exam Vitals reviewed.  Constitutional:      Appearance: He is not ill-appearing.  HENT:     Head: Normocephalic.  Eyes:     Conjunctiva/sclera: Conjunctivae normal.  Cardiovascular:     Rate and Rhythm: Normal rate.  Pulmonary:     Effort: Pulmonary effort is normal. No respiratory distress.  Musculoskeletal:     Comments: Mild swelling superior to L elbow, tender to touch. No bony or epicondylar  tenderness. No erythema or joint effusion.  Pt unable to fully flex or extend, ROM of L elbow with pain  Neurological:     Mental Status: He is alert and oriented to person, place, and time.  Psychiatric:        Mood and Affect: Mood normal.        Behavior: Behavior normal.     No results found for any visits on 12/26/23.  Assessment & Plan    Left elbow pain -     Ambulatory referral to Orthopedics   Acute elbow strain following lifting heavy boxes No numbness or tingling. Soft tissue swelling present Referral to orthopedic specialist recommended for potential MRI if no improvement. - Advise use of elbow compression sleeve to reduce swelling and provide support. - Recommend icing the elbow for 20 minutes every few hours to manage swelling. - Encourage gentle movement of the elbow to prevent stiffness. - Suggest over-the-counter anti-inflammatory medications for pain management.  Return if symptoms worsen or fail to improve.       Alfredia Ferguson, PA-C  [facility] at [facility] [phone] (phone) [phone] (fax)  [facility]

## 2024-01-02 DIAGNOSIS — S46212A Strain of muscle, fascia and tendon of other parts of biceps, left arm, initial encounter: Secondary | ICD-10-CM | POA: Diagnosis not present

## 2024-01-23 DIAGNOSIS — M961 Postlaminectomy syndrome, not elsewhere classified: Secondary | ICD-10-CM | POA: Diagnosis not present

## 2024-01-23 DIAGNOSIS — F1120 Opioid dependence, uncomplicated: Secondary | ICD-10-CM | POA: Diagnosis not present

## 2024-01-23 DIAGNOSIS — Q8500 Neurofibromatosis, unspecified: Secondary | ICD-10-CM | POA: Diagnosis not present

## 2024-01-23 DIAGNOSIS — M5416 Radiculopathy, lumbar region: Secondary | ICD-10-CM | POA: Diagnosis not present

## 2024-01-30 DIAGNOSIS — M5416 Radiculopathy, lumbar region: Secondary | ICD-10-CM | POA: Diagnosis not present

## 2024-05-01 DIAGNOSIS — F1120 Opioid dependence, uncomplicated: Secondary | ICD-10-CM | POA: Diagnosis not present

## 2024-05-01 DIAGNOSIS — Q8500 Neurofibromatosis, unspecified: Secondary | ICD-10-CM | POA: Diagnosis not present

## 2024-05-01 DIAGNOSIS — Z6831 Body mass index (BMI) 31.0-31.9, adult: Secondary | ICD-10-CM | POA: Diagnosis not present

## 2024-05-01 DIAGNOSIS — M5416 Radiculopathy, lumbar region: Secondary | ICD-10-CM | POA: Diagnosis not present

## 2024-05-10 NOTE — Progress Notes (Signed)
 Ben Colbie Danner D.CLEMENTEEN [facility] [address] [place] [address] Phone: [phone]   Assessment and Plan:     1. Left hand pain (Primary) -Chronic with exacerbation, initial sports medicine visit - 3+ months of left thumb pain primarily at first MCP joint.  Most consistent with flare of minimal degenerative changes at first MCP.  Differential includes DeQuervain's Tenosynovitis, though less likely based on HPI and physical exam - Start meloxicam  15 mg daily x2 weeks.  If still having pain after 2 weeks, complete 3rd-week of NSAID. May use remaining NSAID as needed once daily for pain control.  Do not to use additional over-the-counter NSAIDs (ibuprofen, naproxen, Advil, Aleve, etc.) while taking prescription NSAIDs.  May use Tylenol [phone] mg 2 to 3 times a day for breakthrough pain. -Recommend using thumb spica brace that isolates first digit, but does not isolate wrist or other digits.  Recommend using brace over the next 2 to 3 weeks with physical activity -X-ray obtained in clinic.  My interpretation: No acute fracture or dislocation.  Minimal spurring at first MCP  Pertinent previous records reviewed include none  Follow Up: 4 weeks for reevaluation.  If no improvement or worsening symptoms, could consider first MCP CSI   Subjective:   I, Moenique Parris, am serving as a Neurosurgeon for Doctor Morene Mace  Chief Complaint: left thumb pain   HPI:   05/13/2024 Patient is a 48 year old male with left thumb pain. Patient states pain for 3 month. Pain has progressively gotten worse. Decreased ROM. No MOI. 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 Pain meds aren't helping with the hand pain. He tapes his hand and that seems to help. Decrease in grip strength. Isnt been able to do ADLs. Pain is waking him at night.    Relevant Historical Information:  schwannomatosis, using chronic opioid pain medication  Additional pertinent review of  systems negative.   Current Outpatient Medications:    diclofenac (VOLTAREN) 75 MG EC tablet, Take 75 mg by mouth 2 (two) times daily., Disp: , Rfl:    oxyCODONE-acetaminophen (PERCOCET) 7.5-325 MG tablet, take 1 tablet every 8 hours as needed for breakthrough pain, Disp: , Rfl:    DULoxetine (CYMBALTA) 30 MG capsule, Take 30 mg by mouth daily., Disp: , Rfl:    pregabalin (LYRICA) 150 MG capsule, Take 150 mg by mouth 3 (three) times daily., Disp: , Rfl:    XTAMPZA ER 13.5 MG C12A, Take 1 capsule by mouth every 12 (twelve) hours. (Patient not taking: Reported on 07/11/2023), Disp: , Rfl:    Objective:     Vitals:   05/13/24 1008  BP: 132/80  Pulse: 88  SpO2: 96%  Weight: 225 lb (102.1 kg)  Height: 5' 11 (1.803 m)      Body mass index is 31.38 kg/m.    Physical Exam:    General: Appears well, nad, nontoxic and pleasant Neuro:sensation intact, strength is 5/5 in upper extremities, muscle tone wnl Skin:no susupicious lesions or rashes  Left hand/Wrist:   No deformity or swelling appreciated. ROM  Ext 90, flexion 70, radial/ulnar deviation 30 TTP first MCP nttp over the snuff box, dorsal carpals, volar carpals, radial styloid, ulnar styloid,  tfcc Negative Tinel's, Phalen's, Prayer Tests   finklestein cause pain at first MCP, though no pain along tendons Neg tfcc bounce test No pain with resisted ext, flex or deviation    Electronically signed by:  Odis Mace D.O. CAQSM  [place] [facility] 10:24 AM 05/13/24

## 2024-05-13 ENCOUNTER — Ambulatory Visit: Admitting: Sports Medicine

## 2024-05-13 ENCOUNTER — Ambulatory Visit (INDEPENDENT_AMBULATORY_CARE_PROVIDER_SITE_OTHER)

## 2024-05-13 VITALS — BP 132/80 | HR 88 | Ht 71.0 in | Wt 225.0 lb

## 2024-05-13 DIAGNOSIS — M79642 Pain in left hand: Secondary | ICD-10-CM | POA: Diagnosis not present

## 2024-05-13 DIAGNOSIS — M19042 Primary osteoarthritis, left hand: Secondary | ICD-10-CM | POA: Diagnosis not present

## 2024-05-13 MED ORDER — MELOXICAM 15 MG PO TABS: 15.0000 mg | ORAL_TABLET | Freq: Every day | ORAL | 0 refills | Status: AC

## 2024-05-13 NOTE — Patient Instructions (Signed)
-   Start meloxicam  15 mg daily x2 weeks.  If still having pain after 2 weeks, complete 3rd-week of NSAID. May use remaining NSAID as needed once daily for pain control.  Do not to use additional over-the-counter NSAIDs (ibuprofen, naproxen, Advil, Aleve, etc.) while taking prescription NSAIDs.  May use Tylenol 253 129 2761 mg 2 to 3 times a day for breakthrough pain.  Recommend a thumb spica brace that isolates just the thumb joint. Use it with physical activity  for the next 2-3 weeks   4 week follow

## 2024-05-27 ENCOUNTER — Ambulatory Visit: Payer: Self-pay | Admitting: Sports Medicine

## 2024-05-28 DIAGNOSIS — F331 Major depressive disorder, recurrent, moderate: Secondary | ICD-10-CM | POA: Diagnosis not present

## 2024-05-28 DIAGNOSIS — M5416 Radiculopathy, lumbar region: Secondary | ICD-10-CM | POA: Diagnosis not present

## 2024-06-07 NOTE — Progress Notes (Signed)
 Ben Fadi Menter D[facility] [address] [place] [address] Phone: [phone]   Assessment and Plan:     1. Left hand pain (Primary) 2. 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  Patient's pain was most consistent with flare of degenerative changes at first MCP at office visit on 05/13/24, however after completing meloxicam  course, patient's pain appears to be localized to nodule along flexor tendon of first digit consistent with trigger finger with incomplete triggering - Use meloxicam  15 mg daily as needed for pain.  Recommend limiting chronic NSAIDs to 1-2 doses per week to prevent long-term side effects. - Continue to use thumb spica brace/cushion to decrease irritation - Patient elected for trigger finger CSI.  Tolerated well per note below  Procedure: Flexor Tendon Sheath Injection (Trigger Finger) Side: Left 1st digit  Indication: Trigger Finger  After explaining the procedure, viable alternatives, risks, and answering any questions, consent was given verbally.  The site was cleaned with alcohol prep.  A steroid injection was performed under ultrasound guidance using 0.5ml of 2% lidocaine without epinephrine and 20 mg of Kenalog 40 with sterile technique.  This was well tolerated and resulted in  relief.  Needle was removed and dressing placed and post injection instructions were given including  a discussion of likely return of pain today after the anesthetic wears off (with the possibility of worsened pain) until the steroid starts to work in 3-5 days.   Pt was advised to call or return to clinic if these symptoms worsen or fail to improve as anticipated.  If not at least 50% better in 6 weeks would consider repeat injection. Images permanently stored.   Pertinent previous records reviewed include none   Follow Up: 4 weeks for reevaluation.  Could consider additional CSI    Subjective:   I, Eric Hansen, am serving as a Neurosurgeon for Doctor Morene Mace   Chief Complaint: left thumb pain    HPI:    05/13/2024 Patient is a 48 year old male with left thumb pain. Patient states pain for 3 month. Pain has progressively gotten worse. Decreased ROM. No MOI. 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 Pain meds aren't helping with the hand pain. He tapes his hand and that seems to help. Decrease in grip strength. Isnt been able to do ADLs. Pain is waking him at night.    06/11/2024 Patient states thumb brace has helped but he is still in pain    Relevant Historical Information:  schwannomatosis, using chronic opioid pain medication  Additional pertinent review of systems negative.   Current Outpatient Medications:    DULoxetine (CYMBALTA) 30 MG capsule, Take 30 mg by mouth daily., Disp: , Rfl:    meloxicam  (MOBIC ) 15 MG tablet, Take 1 tablet (15 mg total) by mouth daily., Disp: 30 tablet, Rfl: 0   oxyCODONE-acetaminophen (PERCOCET) 7.5-325 MG tablet, take 1 tablet every 8 hours as needed for breakthrough pain, Disp: , Rfl:    pregabalin (LYRICA) 150 MG capsule, Take 150 mg by mouth 3 (three) times daily., Disp: , Rfl:    XTAMPZA ER 13.5 MG C12A, Take 1 capsule by mouth every 12 (twelve) hours. (Patient not taking: Reported on 07/11/2023), Disp: , Rfl:    Objective:     Vitals:   06/11/24 1026  Pulse: (!) 101  SpO2: 95%  Weight: 227 lb (103 kg)  Height: 5' 11 (1.803 m)  Body mass index is 31.66 kg/m.    Physical Exam:    General: Appears well, nad, nontoxic and pleasant Neuro:sensation intact, strength is 5/5 in upper extremities, muscle tone wnl Skin:no susupicious lesions or rashes   Left hand/Wrist:   No   swelling appreciated. Tender, palpable nodule along palmar surface of first MCP that is mobile with first digit flexion/extension.  Consistent with trigger finger. ROM  Ext 90, flexion 70, radial/ulnar deviation 30 TTP palmar  sided first MCP nttp over the snuff box, dorsal carpals, volar carpals, radial styloid, ulnar styloid,  tfcc Negative Tinel's, Phalen's, Prayer Tests Negative finklestein  Neg tfcc bounce test No pain with resisted ext, flex or deviation     Electronically signed by:  Odis Mace [facility] 10:38 AM 06/11/24

## 2024-06-10 ENCOUNTER — Other Ambulatory Visit: Payer: Self-pay | Admitting: Sports Medicine

## 2024-06-11 ENCOUNTER — Ambulatory Visit: Admitting: Sports Medicine

## 2024-06-11 VITALS — HR 101 | Ht 71.0 in | Wt 227.0 lb

## 2024-06-11 DIAGNOSIS — M79642 Pain in left hand: Secondary | ICD-10-CM | POA: Diagnosis not present

## 2024-06-11 DIAGNOSIS — M65312 Trigger thumb, left thumb: Secondary | ICD-10-CM

## 2024-06-11 NOTE — Patient Instructions (Signed)
-   Use meloxicam  15 mg daily as needed for pain.  Recommend limiting chronic NSAIDs to 1-2 doses per week to prevent long-term side effects.  Continue using thumb brace   4 week follow up

## 2024-06-18 DIAGNOSIS — F331 Major depressive disorder, recurrent, moderate: Secondary | ICD-10-CM | POA: Diagnosis not present

## 2024-07-02 DIAGNOSIS — F331 Major depressive disorder, recurrent, moderate: Secondary | ICD-10-CM | POA: Diagnosis not present

## 2024-07-08 NOTE — Progress Notes (Unsigned)
 Ben Jackson D[facility] [address] [place] [address] Phone: [phone]   Assessment and Plan:     1. Left hand pain (Primary) 2. Trigger finger of left thumb -Chronic with exacerbation, subsequent visit - Overall significant improvement in left thumb pain, triggering, weakness of left thumb after trigger finger CSI at office visit on 06/11/2024.  Patient still experiences mild occasional nighttime triggering - May use thumb brace at night to decrease triggering - May use topical Voltaren gel over areas of pain - Use meloxicam  15 mg daily as needed for breakthrough pain.  Recommend limiting chronic NSAIDs to 1-2 doses per week to prevent long-term side effects. Use Tylenol 500 to 1000 mg tablets 2-3 times a day as needed for day-to-day pain relief.       Pertinent previous records reviewed include none   Follow Up: As needed.  Could consider repeat trigger finger CSI   Subjective:   I, Zo Loudon, am serving as a Neurosurgeon for Doctor Morene Mace   Chief Complaint: left thumb pain    HPI:    05/13/2024 Patient is a 48 year old male with left thumb pain. Patient states pain for 3 month. Pain has progressively gotten worse. Decreased ROM. No MOI. 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 Pain meds aren't helping with the hand pain. He tapes his hand and that seems to help. Decrease in grip strength. Isnt been able to do ADLs. Pain is waking him at night.    06/11/2024 Patient states thumb brace has helped but he is still in pain   07/09/2024 Patient states only intermittent flares of pain . Clicking/ triggering at night . But, overall better    Relevant Historical Information:  schwannomatosis, using chronic opioid pain medication  Additional pertinent review of systems negative.   Current Outpatient Medications:    DULoxetine (CYMBALTA) 30 MG capsule, Take 30 mg by mouth daily., Disp: , Rfl:    meloxicam  (MOBIC ) 15  MG tablet, Take 1 tablet (15 mg total) by mouth daily., Disp: 30 tablet, Rfl: 0   oxyCODONE-acetaminophen (PERCOCET) 7.5-325 MG tablet, take 1 tablet every 8 hours as needed for breakthrough pain, Disp: , Rfl:    pregabalin (LYRICA) 150 MG capsule, Take 150 mg by mouth 3 (three) times daily., Disp: , Rfl:    XTAMPZA ER 13.5 MG C12A, Take 1 capsule by mouth every 12 (twelve) hours. (Patient not taking: Reported on 07/11/2023), Disp: , Rfl:    Objective:     Vitals:   07/09/24 1007  BP: 122/84  Pulse: (!) 108  SpO2: 99%  Weight: 222 lb (100.7 kg)  Height: 5' 11 (1.803 m)      Body mass index is 30.96 kg/m.    Physical Exam:    General: Appears well, nad, nontoxic and pleasant Neuro:sensation intact, strength is 5/5 in upper extremities, muscle tone wnl Skin:no susupicious lesions or rashes   Left hand/Wrist:   No   swelling appreciated. Non-Tender, palpable nodule along palmar surface of first MCP that is mobile with first digit flexion/extension.  No active triggering today ROM  Ext 90, flexion 70, radial/ulnar deviation 30 NTTP palmar sided first MCP nttp over the snuff box, dorsal carpals, volar carpals, radial styloid, ulnar styloid,  tfcc Negative Tinel's, Phalen's, Prayer Tests Negative finklestein  Neg tfcc bounce test No pain with resisted ext, flex or deviation     Electronically signed by:  Odis Mace D.O. CAQSM  [facility] 10:18 AM 07/09/24

## 2024-07-09 ENCOUNTER — Ambulatory Visit: Admitting: Sports Medicine

## 2024-07-09 VITALS — BP 122/84 | HR 108 | Ht 71.0 in | Wt 222.0 lb

## 2024-07-09 DIAGNOSIS — M79642 Pain in left hand: Secondary | ICD-10-CM

## 2024-07-09 DIAGNOSIS — M65312 Trigger thumb, left thumb: Secondary | ICD-10-CM

## 2024-07-09 NOTE — Patient Instructions (Signed)
 Could trial thumb brace at night to decrease triggering   Voltaren gel over areas of pain   As needed follow up

## 2024-07-16 DIAGNOSIS — F331 Major depressive disorder, recurrent, moderate: Secondary | ICD-10-CM | POA: Diagnosis not present

## 2024-07-25 DIAGNOSIS — M5416 Radiculopathy, lumbar region: Secondary | ICD-10-CM | POA: Diagnosis not present

## 2024-07-25 DIAGNOSIS — F1120 Opioid dependence, uncomplicated: Secondary | ICD-10-CM | POA: Diagnosis not present

## 2024-07-25 DIAGNOSIS — Q8500 Neurofibromatosis, unspecified: Secondary | ICD-10-CM | POA: Diagnosis not present

## 2024-07-30 DIAGNOSIS — F331 Major depressive disorder, recurrent, moderate: Secondary | ICD-10-CM | POA: Diagnosis not present

## 2024-08-13 DIAGNOSIS — F331 Major depressive disorder, recurrent, moderate: Secondary | ICD-10-CM | POA: Diagnosis not present

## 2024-08-27 DIAGNOSIS — F331 Major depressive disorder, recurrent, moderate: Secondary | ICD-10-CM | POA: Diagnosis not present

## 2024-09-10 DIAGNOSIS — F331 Major depressive disorder, recurrent, moderate: Secondary | ICD-10-CM | POA: Diagnosis not present

## 2024-10-25 NOTE — Progress Notes (Signed)
 "               Odis Mace D[facility] [address] [place] [address] Phone: [phone]   Assessment and Plan:     1. Left hand pain (Primary) 2. Trigger finger of left thumb -Chronic with exacerbation, subsequent visit - Recurrence in left thumb triggering x 1 month - Patient had 3+ months relief after trigger finger CSI 06/11/2024.  Elected for repeat trigger finger CSI today.  Tolerated well per note below. - May continue to use thumb brace at night to decrease triggering - May use topical Voltaren gel over areas of pain - Use meloxicam  15 mg daily as needed for breakthrough pain.  Recommend limiting chronic NSAIDs to 1-2 doses per week to prevent long-term side effects. Use Tylenol 500 to 1000 mg tablets 2-3 times a day as needed for day-to-day pain relief.      Procedure: Flexor Tendon Sheath Injection (Trigger Finger) Side: Left 1st digit  Indication: Trigger Finger  After explaining the procedure, viable alternatives, risks, and answering any questions, consent was given verbally.  The site was cleaned with alcohol prep.  A steroid injection was performed under ultrasound guidance using 0.5ml of 1% lidocaine without epinephrine and 20 mg of Kenalog 40mg /mL with sterile technique.  This was well tolerated and resulted in  relief.  Needle was removed and dressing placed and post injection instructions were given including  a discussion of likely return of pain today after the anesthetic wears off (with the possibility of worsened pain) until the steroid starts to work in 3-5 days.   Pt was advised to call or return to clinic if these symptoms worsen or fail to improve as anticipated.  If not at least 50% better in 6 weeks would consider repeat injection. Images permanently stored.   Pertinent previous records reviewed include none   Follow Up: As needed if no improvement or worsening of symptoms.  Could consider repeat trigger finger CSI versus hand  surgery referral   Subjective:   I, Moenique Parris, am serving as a neurosurgeon for Doctor Morene Mace   Chief Complaint: left thumb pain    HPI:    05/13/2024 Patient is a 49 year old male with left thumb pain. Patient states pain for 3 month. Pain has progressively gotten worse. Decreased ROM. No MOI. 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 Pain meds aren't helping with the hand pain. He tapes his hand and that seems to help. Decrease in grip strength. Isnt been able to do ADLs. Pain is waking him at night.    06/11/2024 Patient states thumb brace has helped but he is still in pain    07/09/2024 Patient states only intermittent flares of pain . Clicking/ triggering at night . But, overall better   10/28/2024 Patient states pain came back a month ago  Relevant Historical Information:  schwannomatosis, using chronic opioid pain medication  Additional pertinent review of systems negative.  Current Medications[1]   Objective:     Vitals:   10/28/24 0828  Pulse: 87  SpO2: 97%  Weight: 223 lb (101.2 kg)  Height: 5' 11 (1.803 m)      Body mass index is 31.1 kg/m.    Physical Exam:    General: Appears well, nad, nontoxic and pleasant Neuro:sensation intact, strength is 5/5 in upper extremities, muscle tone wnl Skin:no susupicious lesions or rashes   Left hand/Wrist:   No   swelling  appreciated. Tender, palpable nodule along palmar surface of first MCP that is mobile with first digit flexion/extension.  Consistent with trigger finger. ROM  Ext 90, flexion 70, radial/ulnar deviation 30 TTP palmar sided first MCP nttp over the snuff box, dorsal carpals, volar carpals, radial styloid, ulnar styloid,  tfcc Negative Tinel's, Phalen's, Prayer Tests Negative finklestein  Neg tfcc bounce test No pain with resisted ext, flex or deviation     Electronically signed by:  Odis Mace [facility] 8:36 AM 10/28/24     [1]  Current Outpatient  Medications:    DULoxetine (CYMBALTA) 30 MG capsule, Take 30 mg by mouth daily., Disp: , Rfl:    meloxicam  (MOBIC ) 15 MG tablet, Take 1 tablet (15 mg total) by mouth daily., Disp: 30 tablet, Rfl: 0   oxyCODONE-acetaminophen (PERCOCET) 7.5-325 MG tablet, take 1 tablet every 8 hours as needed for breakthrough pain, Disp: , Rfl:    pregabalin (LYRICA) 150 MG capsule, Take 150 mg by mouth 3 (three) times daily., Disp: , Rfl:    XTAMPZA ER 13.5 MG C12A, Take 1 capsule by mouth every 12 (twelve) hours. (Patient not taking: Reported on 07/11/2023), Disp: , Rfl:   "

## 2024-10-28 ENCOUNTER — Ambulatory Visit: Admitting: Sports Medicine

## 2024-10-28 VITALS — HR 87 | Ht 71.0 in | Wt 223.0 lb

## 2024-10-28 DIAGNOSIS — M79642 Pain in left hand: Secondary | ICD-10-CM | POA: Diagnosis not present

## 2024-10-28 DIAGNOSIS — M65312 Trigger thumb, left thumb: Secondary | ICD-10-CM

## 2024-10-28 NOTE — Patient Instructions (Signed)
Injected finger today  

## 2025-01-28 ENCOUNTER — Ambulatory Visit: Admitting: Sports Medicine
